# Patient Record
Sex: Male | Born: 1965 | Race: White | Hispanic: No | Marital: Married | Smoking: Never smoker
Health system: Southern US, Community
[De-identification: ages and names within clinical notes are randomized; demographics above are authoritative.]

## PROBLEM LIST (undated history)

## (undated) DIAGNOSIS — I2510 Atherosclerotic heart disease of native coronary artery without angina pectoris: Secondary | ICD-10-CM

## (undated) DIAGNOSIS — Z9289 Personal history of other medical treatment: Secondary | ICD-10-CM

## (undated) DIAGNOSIS — I10 Essential (primary) hypertension: Secondary | ICD-10-CM

## (undated) DIAGNOSIS — E785 Hyperlipidemia, unspecified: Secondary | ICD-10-CM

## (undated) DIAGNOSIS — K219 Gastro-esophageal reflux disease without esophagitis: Secondary | ICD-10-CM

## (undated) DIAGNOSIS — N529 Male erectile dysfunction, unspecified: Secondary | ICD-10-CM

## (undated) DIAGNOSIS — I209 Angina pectoris, unspecified: Secondary | ICD-10-CM

## (undated) DIAGNOSIS — Z9989 Dependence on other enabling machines and devices: Secondary | ICD-10-CM

## (undated) DIAGNOSIS — G4733 Obstructive sleep apnea (adult) (pediatric): Secondary | ICD-10-CM

## (undated) DIAGNOSIS — Z8249 Family history of ischemic heart disease and other diseases of the circulatory system: Secondary | ICD-10-CM

## (undated) DIAGNOSIS — I219 Acute myocardial infarction, unspecified: Secondary | ICD-10-CM

## (undated) HISTORY — PX: TONSILLECTOMY AND ADENOIDECTOMY: SUR1326

## (undated) HISTORY — DX: Male erectile dysfunction, unspecified: N52.9

## (undated) HISTORY — DX: Atherosclerotic heart disease of native coronary artery without angina pectoris: I25.10

## (undated) HISTORY — PX: CHOLECYSTECTOMY: SHX55

## (undated) HISTORY — PX: OTHER SURGICAL HISTORY: SHX169

## (undated) HISTORY — PX: LACERATION REPAIR: SHX5168

## (undated) HISTORY — DX: Family history of ischemic heart disease and other diseases of the circulatory system: Z82.49

## (undated) HISTORY — DX: Obstructive sleep apnea (adult) (pediatric): Z99.89

## (undated) HISTORY — DX: Obstructive sleep apnea (adult) (pediatric): G47.33

## (undated) HISTORY — DX: Personal history of other medical treatment: Z92.89

---

## 2008-12-25 DIAGNOSIS — I219 Acute myocardial infarction, unspecified: Secondary | ICD-10-CM

## 2008-12-25 HISTORY — DX: Acute myocardial infarction, unspecified: I21.9

## 2009-07-05 ENCOUNTER — Encounter: Admission: RE | Admit: 2009-07-05 | Discharge: 2009-07-05 | Payer: Self-pay | Admitting: Cardiology

## 2009-07-09 ENCOUNTER — Inpatient Hospital Stay (HOSPITAL_COMMUNITY): Admission: RE | Admit: 2009-07-09 | Discharge: 2009-07-13 | Payer: Self-pay | Admitting: Cardiology

## 2009-07-09 HISTORY — PX: CORONARY ANGIOPLASTY WITH STENT PLACEMENT: SHX49

## 2010-02-22 ENCOUNTER — Observation Stay (HOSPITAL_COMMUNITY): Admission: EM | Admit: 2010-02-22 | Discharge: 2010-02-22 | Payer: Self-pay | Admitting: Emergency Medicine

## 2011-03-20 LAB — DIFFERENTIAL
Basophils Relative: 7 % — ABNORMAL HIGH (ref 0–1)
Eosinophils Absolute: 0.2 10*3/uL (ref 0.0–0.7)
Eosinophils Relative: 3 % (ref 0–5)
Neutrophils Relative %: 68 % (ref 43–77)

## 2011-03-20 LAB — POCT I-STAT, CHEM 8
Chloride: 107 mEq/L (ref 96–112)
Glucose, Bld: 104 mg/dL — ABNORMAL HIGH (ref 70–99)
HCT: 45 % (ref 39.0–52.0)
Sodium: 138 mEq/L (ref 135–145)
TCO2: 24 mmol/L (ref 0–100)

## 2011-03-20 LAB — CBC
Hemoglobin: 15 g/dL (ref 13.0–17.0)
MCHC: 33.9 g/dL (ref 30.0–36.0)
Platelets: 166 10*3/uL (ref 150–400)
RBC: 5.09 MIL/uL (ref 4.22–5.81)

## 2011-03-20 LAB — POCT CARDIAC MARKERS: Troponin i, poc: <0.05 ng/mL (ref 0.00–0.09)

## 2011-03-20 LAB — PROTIME-INR
INR: 1.03 (ref 0.00–1.49)
Prothrombin Time: 13.4 seconds (ref 11.6–15.2)

## 2011-03-28 ENCOUNTER — Observation Stay (HOSPITAL_COMMUNITY)
Admission: EM | Admit: 2011-03-28 | Discharge: 2011-03-29 | Disposition: A | Discharge status: Home / Self Care | Payer: BC Managed Care – PPO | Attending: Cardiology | Admitting: Cardiology

## 2011-03-28 ENCOUNTER — Emergency Department (HOSPITAL_COMMUNITY): Payer: BC Managed Care – PPO

## 2011-03-28 DIAGNOSIS — I10 Essential (primary) hypertension: Secondary | ICD-10-CM | POA: Insufficient documentation

## 2011-03-28 DIAGNOSIS — G4730 Sleep apnea, unspecified: Secondary | ICD-10-CM | POA: Insufficient documentation

## 2011-03-28 DIAGNOSIS — M79609 Pain in unspecified limb: Secondary | ICD-10-CM | POA: Insufficient documentation

## 2011-03-28 DIAGNOSIS — Z9861 Coronary angioplasty status: Secondary | ICD-10-CM | POA: Insufficient documentation

## 2011-03-28 DIAGNOSIS — R209 Unspecified disturbances of skin sensation: Secondary | ICD-10-CM | POA: Insufficient documentation

## 2011-03-28 DIAGNOSIS — I200 Unstable angina: Secondary | ICD-10-CM | POA: Insufficient documentation

## 2011-03-28 DIAGNOSIS — I2510 Atherosclerotic heart disease of native coronary artery without angina pectoris: Principal | ICD-10-CM | POA: Insufficient documentation

## 2011-03-28 DIAGNOSIS — E785 Hyperlipidemia, unspecified: Secondary | ICD-10-CM | POA: Insufficient documentation

## 2011-03-28 DIAGNOSIS — I252 Old myocardial infarction: Secondary | ICD-10-CM | POA: Insufficient documentation

## 2011-03-28 LAB — DIFFERENTIAL
Basophils Relative: 1 % (ref 0–1)
Eosinophils Absolute: 0.3 10*3/uL (ref 0.0–0.7)
Eosinophils Relative: 3 % (ref 0–5)
Lymphocytes Relative: 12 % (ref 12–46)
Neutro Abs: 6.1 10*3/uL (ref 1.7–7.7)

## 2011-03-28 LAB — CBC
HCT: 43.8 % (ref 39.0–52.0)
Hemoglobin: 15.5 g/dL (ref 13.0–17.0)
MCH: 29.2 pg (ref 26.0–34.0)
MCV: 82.5 fL (ref 78.0–100.0)
RBC: 5.31 MIL/uL (ref 4.22–5.81)
WBC: 8.2 10*3/uL (ref 4.0–10.5)

## 2011-03-28 LAB — COMPREHENSIVE METABOLIC PANEL
ALT: 45 U/L (ref 0–53)
AST: 23 U/L (ref 0–37)
Albumin: 4.3 g/dL (ref 3.5–5.2)
BUN: 10 mg/dL (ref 6–23)
CO2: 24 mEq/L (ref 19–32)
GFR calc non Af Amer: >60 mL/min (ref >60)
Glucose, Bld: 117 mg/dL — ABNORMAL HIGH (ref 70–99)
Total Bilirubin: 1.9 mg/dL — ABNORMAL HIGH (ref 0.3–1.2)
Total Protein: 7.3 g/dL (ref 6.0–8.3)

## 2011-03-28 LAB — CARDIAC PANEL(CRET KIN+CKTOT+MB+TROPI)
Total CK: 34 U/L (ref 7–232)
Total CK: 30 U/L (ref 7–232)
Troponin I: 0.01 ng/mL (ref 0.00–0.06)

## 2011-03-28 LAB — PROTIME-INR: INR: 1.04 (ref 0.00–1.49)

## 2011-03-28 LAB — HEMOGLOBIN A1C: Hgb A1c MFr Bld: 4.9 % (ref <5.7)

## 2011-03-28 LAB — TROPONIN I: Troponin I: 0.01 ng/mL (ref 0.00–0.06)

## 2011-03-28 LAB — POCT CARDIAC MARKERS: Myoglobin, poc: 30.9 ng/mL (ref 12–200)

## 2011-03-28 LAB — CK TOTAL AND CKMB (NOT AT ARMC): Relative Index: INVALID (ref 0.0–2.5)

## 2011-03-29 LAB — LIPID PANEL
Cholesterol: 136 mg/dL (ref 0–200)
HDL: 49 mg/dL (ref >39)
LDL Cholesterol: 73 mg/dL (ref 0–99)
Triglycerides: 68 mg/dL (ref <150)

## 2011-03-29 LAB — CBC
HCT: 43.8 % (ref 39.0–52.0)
Hemoglobin: 15.2 g/dL (ref 13.0–17.0)
MCH: 28.7 pg (ref 26.0–34.0)
RBC: 5.3 MIL/uL (ref 4.22–5.81)
RDW: 14.3 % (ref 11.5–15.5)
WBC: 7.9 10*3/uL (ref 4.0–10.5)

## 2011-03-29 LAB — CARDIAC PANEL(CRET KIN+CKTOT+MB+TROPI)
CK, MB: 0.7 ng/mL (ref 0.3–4.0)
Total CK: 32 U/L (ref 7–232)

## 2011-03-29 LAB — HEPARIN LEVEL (UNFRACTIONATED): Heparin Unfractionated: 0.26 IU/mL — ABNORMAL LOW (ref 0.30–0.70)

## 2011-03-31 NOTE — Procedures (Signed)
NAMECRISS, Chavez NO.:  [id number]  MEDICAL RECORD NO.:  [id number]           PATIENT TYPE:  O  LOCATION:  6522                         FACILITY:  [facility]  PHYSICIAN:  Landry Corporal, MD DATE OF BIRTH:  21-Mar-1966  DATE OF PROCEDURE:  03/28/2011 DATE OF DISCHARGE:  03/29/2011                           CARDIAC CATHETERIZATION   PRIMARY CARDIOLOGIST:  Dr. Lynnea Ferrier and Dr. Allyson Sabal from  [facility].  PERFORMING PHYSICIAN:  Landry Corporal, MD.  PROCEDURE PERFORMED: 1. Left heart catheterization via right radial artery access with 5-     French sheath. 2. Left ventriculogram via the RAO projection, 10 mL of contrast per     second for 30 mL. 3. Native coronary angiography. 4. Intracoronary nitroglycerin injection.  INDICATIONS:  New onset of chest pain concerning for unstable angina and patient with known coronary artery disease, status post PCI x3.  BRIEF HISTORY:  Mr. Kyle Chavez is a 45 year old gentleman with history of known coronary artery disease with ACS in July 2010, status post PCI of both the first obtuse marginal branch and circumflex with a Xience 2.5 x 20 mm stent as well as the right posterolateral branch with Xience 2.5 x 20 mm drug-eluting stent.  He then subsequently underwent cardiac catheterization in March 2011 and was noted to have a widely patent stents with a roughly 70%-80% tandem lesions in the ramus intermedius /high obtuse marginal vessel.  He now presented to [facility] with a new onset of chest pain concerning for unstable angina and is referred for diagnostic cardiac catheterization.  Risks, benefits, alternatives and indications of procedure were explained to the patient in detail and informed consent was obtained with a sign and placed on the chart.  PROCEDURE:  The patient was brought to second floor [facility] in a fasting state.  A modified Allen test  was performed on the right wrist and noted to have excellent collateral flow on scout angiography.  Therefore, the patient was prepped and draped with the right radial artery access site sterilely.  A time-out period was performed and the patient was sedated with intravenous Versed and fentanyl.  The right wrist was anesthetized with 1% subcutaneous lidocaine and the right radial artery was then accessed using Seldinger technique with placement of 5-French sheath.  Sheath was aspirated and flushed and infiltrated with a total 10 mL of standard radial cocktail. The additional intravenous heparin bolus of weight-based was administered, at the time, the patient received 50 units/kg.  Then, a 5- [place] TIG 4.0 catheter was advanced over safety J-wire and used to engage first the left and right coronary artery.  Multiple angiographic views of first right and left coronary artery system were obtained. With the catheter in the left coronary artery, intracoronary nitroglycerin was administered to better evaluate the high obtuse marginal/ramus intermedius branch lesion.  Multiple passes between the previous catheterizations and the current catheterization were done by both myself and Dr. Allyson Sabal as well as multiple members of the cath lab staff.  After much deliberation, decision was made to proceed with performing left ventriculogram, therefore, the TIG catheter  was removed over the safety J-wire and exchanged for a 5-French angled pigtail catheter, which was used to advance across the aortic valve for measurement of left ventricular hemodynamics.  Left ventriculogram was then performed as described above.  After completion, the left ventricular hemodynamics were then remeasured and the catheter was pulled back across the aortic valve, measuring the pullback gradient. The catheter was then removed out of the body over wire without any complications.  As decision was made to terminate the  procedure, the sheath was then removed in the catheterization lab with placement of TR band with 30 mL of air at 1324 hours.  There is no complications.  Estimated blood loss was less than 10 mL.  The patient is stable for during and after the procedure.  CATHETERIZATION STATISTICS: 1. Sedation:  2 mg of Versed and 25 mcg of fentanyl. 2. Radial cocktail:  400 mcg nitroglycerin, 2 mL of 1% lidocaine, 5 mg     of verapamil, and diluted in 10 mL of normal saline. 3. Contrast:  Total 90 mL. 4. Heparin bolus:  Weight-based 50 units/kg was administered at the     time of sheath placement.  HEMODYNAMICS: 1. Aortic pressure 101/68 mmHg with mean of 84 mmHg. 2. Left ventricular pressure 103/30 mmHg with an EDP of 10 mmHg. 3. Left ventriculography demonstrated vigorous LV contraction with EF     of 65%.  No wall motion abnormalities.  ANGIOGRAPHIC FINDINGS: 1. Left main is a short vessel which is essentially trifurcates into     the LAD and circumflex, but there is a very high obtuse marginal,     which could be either considered obtuse marginal versus ramus     intermedius. 2. There is no evidence of disease in the left main. 3. The LAD is a large-caliber vessel, which reaches down to the apex     and gives rise to 2 diagonal branches and the LAD itself was free     of any significant disease. 4. The circumflex artery courses down to the atrioventricular groove.     It gives rise to moderate-to-large caliber obtuse marginal before     going to the atrioventricular groove.  There is a widely patent     stent in the proximal portion of this vessel. 5. The ramus intermedius/high-obtuse marginal courses in a vessel with     large distribution.  It may be 2-mm to 2.25 mm at the greatest.       There are tandem 70% lesions, one proximal, one mid that were similar      appearance to the previous cardiac catheterization in March 2011. 6. The right coronary artery is a moderate-to-large caliber  vessel, is     dominant, giving rise to posterior descending artery as well as     posterolateral branch.  The posterolateral branch came as a large     distribution and has a widely patent stent just after the bend from     the right posterior atrioventricular groove artery.  IMPRESSION: 1. Stable ramus intermedius disease without any significant change     from March 2011, not likely due to the culprit for acute onset     chest pain after the entire year of being stable. 2. Widely patent stents in the right posterolateral 1 branch as well     as the obtuse marginal 1 branch. 3. Consider possible spasm versus nonanginal chest pain.  PLAN: 1. Continue to optimize medical management.  We will increase  his     Imdur dose and likely we will have not to increase his beta-blocker     as an outpatient. 2. Scheduled for outpatient Myoview to evaluate for any evidence of     significant ischemia in the anterolateral distribution consistent     with ramus intermedius.  If significant ischemia is noted, consider     planned intervention of this vessel.  The thought at this time was     that a relatively small-caliber vessel and two tandem lesions would     require a very long stent that would have to be brought back almost     to the parent vessel and this was thought     to be potentially a challenging procedure to avoid encroaching upon     the very proximal circumflex and even left main artery.  Case was reviewed with Dr. Allyson Sabal and discussed with Dr. Lynnea Ferrier, both agreed to this plan.          ______________________________ Landry Corporal, MD     DWH/MEDQ  D:  03/29/2011  T:  03/30/2011  Job:  [id number]  cc:   [facility] Nanetta Batty, M.D. Ritta Slot, MD  Electronically Signed by Bryan Lemma MD on 03/31/2011 09:58:39 AM

## 2011-04-02 LAB — CK TOTAL AND CKMB (NOT AT ARMC)
CK, MB: 7.7 ng/mL — ABNORMAL HIGH (ref 0.3–4.0)
Relative Index: INVALID (ref 0.0–2.5)

## 2011-04-02 LAB — BASIC METABOLIC PANEL
BUN: 22 mg/dL (ref 6–23)
BUN: 9 mg/dL (ref 6–23)
CO2: 25 mEq/L (ref 19–32)
Chloride: 101 mEq/L (ref 96–112)
Chloride: 97 mEq/L (ref 96–112)
Creatinine, Ser: 0.72 mg/dL (ref 0.4–1.5)
Creatinine, Ser: 0.87 mg/dL (ref 0.4–1.5)
GFR calc Af Amer: >60 mL/min (ref >60)
GFR calc non Af Amer: >60 mL/min (ref >60)
GFR calc non Af Amer: >60 mL/min (ref >60)
Glucose, Bld: 108 mg/dL — ABNORMAL HIGH (ref 70–99)
Glucose, Bld: 113 mg/dL — ABNORMAL HIGH (ref 70–99)
Potassium: 3.4 mEq/L — ABNORMAL LOW (ref 3.5–5.1)
Potassium: 3.8 mEq/L (ref 3.5–5.1)
Potassium: 4.2 mEq/L (ref 3.5–5.1)
Sodium: 136 mEq/L (ref 135–145)

## 2011-04-02 LAB — CARDIAC PANEL(CRET KIN+CKTOT+MB+TROPI)
CK, MB: 0.8 ng/mL (ref 0.3–4.0)
CK, MB: 1.5 ng/mL (ref 0.3–4.0)
CK, MB: 10.3 ng/mL — ABNORMAL HIGH (ref 0.3–4.0)
CK, MB: 15.1 ng/mL — ABNORMAL HIGH (ref 0.3–4.0)
CK, MB: 29.6 ng/mL — ABNORMAL HIGH (ref 0.3–4.0)
Relative Index: 12.3 — ABNORMAL HIGH (ref 0.0–2.5)
Relative Index: 13.7 — ABNORMAL HIGH (ref 0.0–2.5)
Total CK: 37 U/L (ref 7–232)
Troponin I: 1.41 ng/mL (ref 0.00–0.06)
Troponin I: 2.08 ng/mL (ref 0.00–0.06)
Troponin I: 2.87 ng/mL (ref 0.00–0.06)

## 2011-04-02 LAB — MAGNESIUM: Magnesium: 1.8 mg/dL (ref 1.5–2.5)

## 2011-04-02 LAB — CBC
HCT: 42.8 % (ref 39.0–52.0)
HCT: 43.3 % (ref 39.0–52.0)
Hemoglobin: 14.8 g/dL (ref 13.0–17.0)
Hemoglobin: 15.2 g/dL (ref 13.0–17.0)
MCV: 86.8 fL (ref 78.0–100.0)
Platelets: 149 10*3/uL — ABNORMAL LOW (ref 150–400)
Platelets: 151 10*3/uL (ref 150–400)
Platelets: 169 10*3/uL (ref 150–400)
RBC: 4.94 MIL/uL (ref 4.22–5.81)
RBC: 4.98 MIL/uL (ref 4.22–5.81)
RBC: 5.12 MIL/uL (ref 4.22–5.81)
WBC: 10.3 10*3/uL (ref 4.0–10.5)
WBC: 10.9 10*3/uL — ABNORMAL HIGH (ref 4.0–10.5)
WBC: 9 10*3/uL (ref 4.0–10.5)

## 2011-04-02 LAB — TROPONIN I: Troponin I: 2.39 ng/mL (ref 0.00–0.06)

## 2011-04-17 NOTE — Discharge Summary (Signed)
NAMEBERT, PTACEK             ACCOUNT NO.:  [id number]  MEDICAL RECORD NO.:  [id number]           PATIENT TYPE:  O  LOCATION:  6522                         FACILITY:  [facility]  PHYSICIAN:  Wilburt Finlay, PA        DATE OF BIRTH:  [birth year]  DATE OF ADMISSION:  03/28/2011 DATE OF DISCHARGE:  03/29/2011                              DISCHARGE SUMMARY   DISCHARGE DIAGNOSES: 1. Chest pain, unstable angina. 2. Hypertension. 3. Coronary artery disease status post stent to the distal circumflex     and RCA as well as residual disease to the ramus July of 2010.     Left heart cath this admission showed stable coronary disease.  No     change from February 22, 2010, and widely patent stents in the RCA OM1.     Ejection fraction 65%. 4. History of sleep apnea. 5. History of hyperlipidemia.  HOSPITAL COURSE:  Mr. Lambertson is a 45 year old Caucasian male with history of coronary artery disease with stents in July of 2010, to the distal RCA and circumflex artery.  He also has a diffusely diseased ramus.  History also includes sleep apnea, hypertension, hyperlipidemia, erectile dysfunction.  He has a strong family history of heart disease. His father was 37.  He has had 4 heart attacks.  Both paternal grandfather and maternal grandfather had mass of MI's and his brother had heart attack at age 54.  His last stress test was August 03, 2009, which showed an ejection fraction of 71% low-risk scan and his last cath was prior to this admission was March 2011, which revealed patent stents unchanged anatomy.  Mr. Luffman presented with chest pain with radiation to the left shoulder, received some relief with one nitroglycerin.  He was admitted to rule out acute coronary syndrome. Cardiac enzymes were cycled.  The patient is given IV morphine 2 mg for pain in the ED, cycles cardiac enzymes q.8 hours, IV heparin was started, continued his home meds.  I will check the A1c, lipids, and given his personal  and family history, he was scheduled for left heart cath for March 29, 2011.  The patient stated that he had short episode of chest pain the morning of March 29, 2011, lasted just a few minutes, and was resolved prior to being rounded on.  Left heart cath showed ejection fraction of 65%, patent stents in the right coronary artery and the circumflex as well as 70% diffuse occlusion in the ramus which is unchanged from prior cath in March of 2011.  Currently, the patient is stable, awaiting, and will be ready for discharge after appropriate rest.  DISCHARGE LABORATORY DATA:  WBC 7.9, hemoglobin 15.2, hematocrit 43.8, platelets 198.  Sodium 135, potassium 4.4, chloride 112, carbon dioxide 24, glucose 117, BUN 10, creatinine 0.80, total bilirubin 1.9, alk phos 50, AST 23, ALT 45, total protein 7.3, albumin 4.3, calcium 9.4.  A1c is 4.9, mean plasma glucose 94, lipase of 19.  Cardiac enzymes were negative x3.  Total cholesterol 136, triglycerides 68, HDL 49, LDL 73, VLDL 14, and total cholesterol HDL ratio is 2.8.  TSH is 1.610.  STUDIES/PROCEDURES:  Chest x-ray on March 28, 2011, shows no acute abnormality.  Heart remains normal in size.  Lungs are clear with normal vascularity.  Unremarkable bones.  Cholecystectomy clips noted. Cardiac catheterization on March 29, 2011, with ejection fraction 65%. Patent stents in the right coronary artery and circumflex with 70% diffuse disease in the ramus vessel.  CURRENT MEDICATIONS: 1. Nitroglycerin sublingual 0.4 mg 1 tablet under the tongue every 5     minutes up to 3 tablets total for chest pain. 2. Aspirin enteric-coated 325 mg 1 tablet by mouth daily. 3. Losartan 50 mg 1 tablet by mouth daily. 4. Propranolol SR 80 mg 1 tablet by mouth daily in the morning. 5. Bystolic 10 mg 1 tablet by mouth daily. 6. Famotidine 40 mg 1 tablet by mouth daily. 7. Plavix 75 mg 1 tablet by mouth daily. 8. Nifedipine XR 30 mg 1 tablet by mouth daily. 9. Imdur XR 60  mg 1 tablet by mouth daily. 10.Pravastatin 20 mg 1 tablet by mouth daily.  DISPOSITION:  Mr. Ursua will be discharged home in stable condition. He is to increase activity slowly.  It is recommended that he eats heart- healthy diet.  He is okay to shower and bathe.  No lifting for 2 days with his right arm.  No driving for 2 days.  If catheter site becomes red, painful, swollen, or discharge of fluid or pus, he is to call our office immediately, and he will follow up Dr. Lynnea Ferrier, at [facility] approximately 3-4 weeks and our office will call him with the appointment.          ______________________________ Wilburt Finlay, PA     BH/MEDQ  D:  03/29/2011  T:  03/30/2011  Job:  [id number]  cc:   Ritta Slot, MD  Electronically Signed by Wilburt Finlay PA on 04/07/2011 04:27:42 PM Electronically Signed by Ritta Slot MD on 04/17/2011 04:04:21 PM

## 2011-04-18 DIAGNOSIS — Z9289 Personal history of other medical treatment: Secondary | ICD-10-CM

## 2011-04-18 HISTORY — DX: Personal history of other medical treatment: Z92.89

## 2011-05-09 NOTE — Discharge Summary (Signed)
[facility]             ACCOUNT NO.:  [id number]   MEDICAL RECORD NO.:  [id number]          PATIENT TYPE:  INP   LOCATION:  2917                         FACILITY:  [facility]   PHYSICIAN:  Cristy Hilts. Jacinto Halim, MD       DATE OF BIRTH:  11-12-1966   DATE OF ADMISSION:  07/09/2009  DATE OF DISCHARGE:  07/13/2009                               DISCHARGE SUMMARY   DISCHARGE DIAGNOSES:  1. Chest pain, that is classic angina pectoris.  2. Coronary artery disease with 95% posterolateral (coronary) artery      stenosis undergoing percutaneous transluminal angioplasty and stent      deployment with XIENCE drug-eluting stent, circumflex with 80%      stenosis undergoing percutaneous transluminal coronary angioplasty      and stent with XIENCE drug-eluting stent.      a.     Residual coronary disease with 80-90% obtuse marginal       (coronary artery) 1 disease of the small vessel, 30% diag 2       disease, and 20% left anterior descending (coronary artery)       disease.  3. Normal ejection fraction of 60%.  4. Peri-procedure myocardial infarction with postprocedure chest pain,      resolved at discharge.  5. Hypertension, now controlled and has been hypotensive prior to      discharge.  6. Symptoms of obstructive sleep apnea.  7. Hyperlipidemia.  8. Nonsustained ventricular tachycardia, resolved.  9. Strong family history of coronary artery disease.   DISCHARGE CONDITION:  Improved.   PROCEDURES:  1. Combined left heart cath, July 09, 2009, by Dr. Jacinto Halim.   1. Percutaneous transluminal coronary angioplasty and stent deployment      to the posterolateral (coronary) artery with a drug-eluting stent      by Dr. Jacinto Halim and percutaneous transluminal coronary angioplasty and      drug-eluting stent XIENCE to the circumflex by Dr. Jacinto Halim, continues      with residual disease.   DISCHARGE MEDICATIONS:  1. Lisinopril 5 mg 1 twice a day for blood pressure and heart.  2. Lopressor 50 mg 1 twice a  day for blood pressure and heart.  3. Crestor 20 mg 1 daily for cholesterol.  4. Plavix 75 mg 1 daily, do not stop, stopping could cause a heart      attack.  5. Enteric-coated aspirin 325 mg 1 daily for stent.  6. Pepcid 40 mg 1 daily to protect her stomach.  7. Nitroglycerin 1/150 with 1 under tongue, 1 every 5 minutes if      needed for chest pain while sitting, 1 every 5 minutes up to 3      every 15 minutes.  If no relief, call 911.  8. Stop Micardis HCT as needed.  9. Stop amlodipine.  10.Stop Inderal 40 b.i.d.  11.Hold Cialis 20 mg half as needed until you see Dr. Allyson Sabal back in      the office.   DISCHARGE INSTRUCTIONS:  1. Return to work after you see Dr. Allyson Sabal.  2. Low-sodium heart-healthy diet.  3. Wash cath site  with soap and water.  Call if any bleeding,      swelling, or drainage.  4. Increase activity slowly.  No lifting for 1 week.  No driving for 1      week.  5. Followup with Dr. Allyson Sabal, July 23, 2009, at 2:15 p.m.   HOSPITAL COURSE:  The patient was admitted for cardiac catheterization  after seeing Dr. Jacinto Halim with complaints of chest pain.  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  He would have chest pain despite climbing two  flights of steps.  Then, pain has increased in length and in intensity.  He saw Dr. Lorin Picket, ordered treadmill stress test, and was sent to Dr.  Jacinto Halim.  The patient also has a strong family history of premature  coronary disease.  Father with open heart surgery, massive MI at 9;  maternal grandfather, massive MI in his 4s and died in his 42s; one  brother who had a MI in his 49s; another uncle had open heart surgery in  his 40s.  Question was to proceed with stress Myoview versus cardiac  catheterization, but because of the severity of the family history and  his classic symptoms of angina, cardiac  catheterization was chosen.  Dr.  Lucila Maine agreed with this plan.  The patient also complained of  symptoms of obstructive sleep apnea.   The patient came to [facility] and underwent cardiac cath revealing  significant coronary disease and undergoing PTA and stents as stated  previously, has residual disease, that will be treated medically.   Postprocedure, his cardiac enzymes were positive.  Further chest pain  medications were adjusted and he was kept in the step-down unit.   On July 11, 2009, cardiac cath films were reviewed.  It was felt we  would just monitor him and Imdur was added, but blood pressure dropped  with Imdur, so therefore, this was discontinued as well as the Norvasc.   Prior to his discharge home, his lisinopril was 10 mg twice a day and  Lopressor 50 twice a day, but blood pressure dropped again into the 80s;  therefore, he was kept a couple more hours, encouraged to drink fluids,  and lisinopril has been decreased to 5 mg twice a day.   LABORATORY DATA:  Hemoglobin at discharge 15.2, hematocrit 43.3, WBC 9,  platelets 151, MCV 86.9.   Chemistry at one point, sodium was 132, potassium 3.4.  He was given  potassium supplement.  At discharge, sodium 135, potassium 3.8, chloride  100, CO2 of 23, BUN 22, creatinine 0.87, glucose 97.  Coags were 13.9,  INR of 1, and PTT 28.   Cardiac enzymes, initial CK was 98 with MB of 10.3, bumped up to 264  with MB of 36 up to 241 with MB of 29.6 and prior to discharge, CK 28,  MB 0.8, troponin I initially was 0.25, bumped to a high of 3 that was  peak and prior to discharge troponin I of 1.14.   Magnesium was 1.8, calcium 9.   The patient ambulated with cardiac rehab, had no real dizziness with  blood pressure in the 80s, but we will monitor, and once blood pressure  slightly improves, we will discharge home on July 13, 2009.  He will  follow up with Dr. Allyson Sabal on new medications.  He will  need to hold the  Cialis until  after he sees Dr. Allyson Sabal to ensure he is stable prior to  using this medication.      Darcella Gasman. [place], N.P.      Cristy Hilts. Jacinto Halim, MD     LRI/MEDQ  D:  07/13/2009  T:  07/14/2009  Job:  [id number]   cc:   Dr. Colbert Ewing, MD

## 2011-05-09 NOTE — Discharge Summary (Signed)
[facility], [facility]             ACCOUNT NO.:  [id number]   MEDICAL RECORD NO.:  [id number]          PATIENT TYPE:  INP   LOCATION:  2917                         FACILITY:  [facility]   PHYSICIAN:  Cristy Hilts. Jacinto Halim, MD       DATE OF BIRTH:  11/10/1966   DATE OF ADMISSION:  07/09/2009  DATE OF DISCHARGE:  07/13/2009                               DISCHARGE SUMMARY   ADDENDUM   Put [name] out of work at least through July 23, 2009,  additionally we gave him Lortab 1 tab every 6 hours as needed for back  pain after lying in the bed for so long, he was having back discomfort.      Darcella Gasman. [place], N.P.      Cristy Hilts. Jacinto Halim, MD     LRI/MEDQ  D:  07/13/2009  T:  07/14/2009  Job:  [id number]   cc:   Nanetta Batty, M.D.  Lucila Maine

## 2011-05-09 NOTE — Cardiovascular Report (Signed)
NAMEZIGMOND, TRELA             ACCOUNT NO.:  [id number]   MEDICAL RECORD NO.:  [id number]          PATIENT TYPE:  INP   LOCATION:  2502                         FACILITY:  [facility]   PHYSICIAN:  Cristy Hilts. Jacinto Halim, MD       DATE OF BIRTH:  10-01-1966   DATE OF PROCEDURE:  07/09/2009  DATE OF DISCHARGE:                            CARDIAC CATHETERIZATION   PROCEDURE PERFORMED:  Left heart catheterization including:  1. Selective right and left coronary arteriography.  2. PTCA and direct stenting of the circumflex coronary artery.  3. PTCA and stenting of the posterolateral branch of the right      coronary artery.   INDICATIONS:  Mr. Obeso is a very pleasant 45 year old gentleman with  a strong family history of premature coronary artery disease.  He  presented with chest pain very typical for angina pectoris.  Given his  cardiac risk factors and high suspicion for coronary disease, he was  brought to the cardiac catheterization lab to evaluate his coronary  anatomy.   HEMODYNAMIC DATA:  The left ventricular pressure was 96/-4 with end  diastolic pressure of 4 mmHg.  Aortic pressure was 84/60 with a mean of  72 mmHg.  There was no significant pressure gradient across the aortic  valve.   Right coronary artery:  Right coronary artery is a large-caliber vessel  and a dominant vessel giving origin to a large PDA and large PLA.  The  PLA branch has an unstable plaque with a 95% stenoses.  This is a long  segment stenoses.   Left main coronary:  Left main coronary is short and bifurcates  immediately.   Circumflex:  Circumflex coronary artery is large-caliber vessel.  Gives  origin to high obtuse marginal 1 which is relatively large.  However, it  is a 1.75 at most 2-mm vessel.  It is a long segment stenoses of 90%  involved in this vessel.  The circumflex coronary artery itself after  the origin of the AV groove branch has an 80% stenoses.   LAD:  LAD is a large-caliber vessel.  It  has got mild luminal  irregularity.  Gives origin to moderate-sized diagonal I and diagonal  II.  The diagonal II has an ostial 20% stenoses.   INTERVENTION DATA:  Successful PTCA and direct stenting of the  circumflex coronary artery with implantation of a 2.5- x 23-mm XIENCE  stent.  The stent was deployed at 12 atmospheric pressure followed by  post dilatation with a 2.5- x 15-mm Voyager [place] at 18 atmospheric  pressure.  Stenosis reduced from 80% to 0% with brisk TIMI III to TIMI  III flow maintained at end of procedure.   Successful PTCA and initial predilatation because of inability to cross  the high-grade stenosis of the PL branch of the right coronary artery.  This was done with a 2.5- x 15-mm Voyager balloon at 10-12 atmospheric  pressure followed by a 2.5- x 28-mm XIENCE stent implantation at 10  atmospheric pressure for 30 seconds followed by post dilatation with a  2.5- x 15-mm Voyager [place] at 18 atmospheric pressure and  16 atmospheric  pressure x2 with overall reduction in stenosis from 95% to 0% with brisk  TIMI III to TIMI III flow maintained at the end of the procedure.   RECOMMENDATIONS:  The ramus intermediate branch is 1.75 to at most 2-mm  vessel.  I would recommend that he continue aggressive medical therapy,  and if he continues to have angina pectoris then consideration can be  given for using a scoring balloon to perform arthrotomy across the long  segment lesion which at least measures about 30-40 mm.   He will be discharged home in the morning.  Unless his cardiac enzymes  are positive, then I will keep him for another 24 hours.  Depending on  his clinical scenario if he is hemodynamically stable, I may discharge  him in the morning with high dose of statin.   A total of 125 mL of contrast was utilized for diagnostic and  interventional procedure.   TECHNIQUE OF PROCEDURE:  Under usual sterile precautions using a 5-  French right radial access, a Jacky  5-French catheter was utilized to  perform selective left to right coronary arteriography and also left  ventriculography.  The catheter was then pulled out of the body in the  usual fashion over an exchange-length 0.035th of an inch Rosen wire was  utilized for catheter exchanges.   Using Angiomax for anticoagulation, an 5-French XP 3.5 guide was  utilized to engage the left main coronary artery.  Using Angiomax and  maintaining ACT greater than 200, Asahi Prowater wire was advanced in  the circumflex coronary artery and direct stenting was performed.  Nitroglycerin was also administered intracoronary.  Angiography revealed  excellent results poststenting.  The wire was withdrawn  angiographically.  Guide catheter disengaged and pulled out of the body.   A 5-French JR-4 guide catheter was utilized to engage the right coronary  artery.  I initially tried to directly stent the lesion with a 2.5- x 28-  mm XIENCE stent because of inability to cross the stent.  Predilatation  was performed with a noncompliant 2.5- x 15-mm Voyager [place] followed by  stent implantation with excellent results.  Overall, the stenosis was  reduced from 95% to 0% with brisk TIMI III to TIMI III flow maintained  at the end of the procedure.   The patient did have some chest discomfort on and off during the  procedure which I expect him to.  No dissection or thrombus was evident.  No side branches were compromised.      Cristy Hilts. Jacinto Halim, MD     JRG/MEDQ  D:  07/09/2009  T:  07/10/2009  Job:  [id number]   cc:   Maryella Shivers. Lorin Picket, MD

## 2012-03-05 ENCOUNTER — Other Ambulatory Visit: Payer: Self-pay

## 2012-03-05 ENCOUNTER — Encounter (HOSPITAL_COMMUNITY)
Admission: AD | Disposition: A | Discharge status: Home / Self Care | Payer: Self-pay | Source: Other Acute Inpatient Hospital | Attending: Cardiovascular Disease

## 2012-03-05 ENCOUNTER — Encounter (HOSPITAL_COMMUNITY): Payer: Self-pay | Admitting: Cardiology

## 2012-03-05 ENCOUNTER — Inpatient Hospital Stay (HOSPITAL_COMMUNITY)
Admission: AD | Admit: 2012-03-05 | Discharge: 2012-03-06 | DRG: 287 | Disposition: A | Discharge status: Home / Self Care | Payer: PRIVATE HEALTH INSURANCE | Source: Other Acute Inpatient Hospital | Attending: Cardiovascular Disease | Admitting: Cardiovascular Disease

## 2012-03-05 DIAGNOSIS — I2510 Atherosclerotic heart disease of native coronary artery without angina pectoris: Principal | ICD-10-CM

## 2012-03-05 DIAGNOSIS — E785 Hyperlipidemia, unspecified: Secondary | ICD-10-CM | POA: Diagnosis present

## 2012-03-05 DIAGNOSIS — Z8249 Family history of ischemic heart disease and other diseases of the circulatory system: Secondary | ICD-10-CM

## 2012-03-05 DIAGNOSIS — I200 Unstable angina: Secondary | ICD-10-CM | POA: Diagnosis present

## 2012-03-05 DIAGNOSIS — I10 Essential (primary) hypertension: Secondary | ICD-10-CM | POA: Diagnosis present

## 2012-03-05 DIAGNOSIS — Z9861 Coronary angioplasty status: Secondary | ICD-10-CM

## 2012-03-05 DIAGNOSIS — Z7902 Long term (current) use of antithrombotics/antiplatelets: Secondary | ICD-10-CM

## 2012-03-05 DIAGNOSIS — Z7982 Long term (current) use of aspirin: Secondary | ICD-10-CM

## 2012-03-05 HISTORY — DX: Angina pectoris, unspecified: I20.9

## 2012-03-05 HISTORY — DX: Acute myocardial infarction, unspecified: I21.9

## 2012-03-05 HISTORY — DX: Essential (primary) hypertension: I10

## 2012-03-05 HISTORY — DX: Hyperlipidemia, unspecified: E78.5

## 2012-03-05 HISTORY — PX: LEFT HEART CATHETERIZATION WITH CORONARY ANGIOGRAM: SHX5451

## 2012-03-05 HISTORY — PX: CARDIAC CATHETERIZATION: SHX172

## 2012-03-05 SURGERY — LEFT HEART CATHETERIZATION WITH CORONARY ANGIOGRAM
Anesthesia: LOCAL

## 2012-03-05 MED ORDER — ACETAMINOPHEN 325 MG PO TABS: 650.0000 mg | ORAL_TABLET | ORAL | Status: DC | PRN

## 2012-03-05 MED ORDER — PROPRANOLOL HCL ER 80 MG PO CP24
80.0000 mg | ORAL_CAPSULE | Freq: Every day | ORAL | Status: DC
  Administered 2012-03-05: 80 mg via ORAL
  Filled 2012-03-05 (×2): qty 1

## 2012-03-05 MED ORDER — ASPIRIN 81 MG PO CHEW: 81.0000 mg | CHEWABLE_TABLET | Freq: Every day | ORAL | Status: DC

## 2012-03-05 MED ORDER — LOSARTAN POTASSIUM 50 MG PO TABS: 50.0000 mg | ORAL_TABLET | Freq: Every day | ORAL | Status: DC

## 2012-03-05 MED ORDER — TOPIRAMATE 25 MG PO CPSP: 25.0000 mg | ORAL_CAPSULE | Freq: Every day | ORAL | Status: DC

## 2012-03-05 MED ORDER — SODIUM CHLORIDE 0.9 % IV SOLN: INTRAVENOUS | Status: DC

## 2012-03-05 MED ORDER — LOSARTAN POTASSIUM 50 MG PO TABS
50.0000 mg | ORAL_TABLET | Freq: Every day | ORAL | Status: DC
  Administered 2012-03-06: 50 mg via ORAL
  Filled 2012-03-05: qty 1

## 2012-03-05 MED ORDER — ASPIRIN 81 MG PO CHEW: 324.0000 mg | CHEWABLE_TABLET | ORAL | Status: DC

## 2012-03-05 MED ORDER — ISOSORBIDE MONONITRATE ER 30 MG PO TB24: 30.0000 mg | ORAL_TABLET | Freq: Every day | ORAL | Status: DC

## 2012-03-05 MED ORDER — SODIUM CHLORIDE 0.9 % IV SOLN
INTRAVENOUS | Status: AC
  Administered 2012-03-05: 16:00:00 via INTRAVENOUS

## 2012-03-05 MED ORDER — SODIUM CHLORIDE 0.9 % IJ SOLN: 3.0000 mL | Freq: Two times a day (BID) | INTRAMUSCULAR | Status: DC

## 2012-03-05 MED ORDER — ISOSORBIDE MONONITRATE ER 30 MG PO TB24
30.0000 mg | ORAL_TABLET | Freq: Every day | ORAL | Status: DC
  Administered 2012-03-06: 30 mg via ORAL
  Filled 2012-03-05: qty 1

## 2012-03-05 MED ORDER — ASPIRIN EC 81 MG PO TBEC
81.0000 mg | DELAYED_RELEASE_TABLET | Freq: Every day | ORAL | Status: DC
  Administered 2012-03-06: 81 mg via ORAL
  Filled 2012-03-05: qty 1

## 2012-03-05 MED ORDER — FENTANYL CITRATE 0.05 MG/ML IJ SOLN
INTRAMUSCULAR | Status: AC
  Filled 2012-03-05: qty 2

## 2012-03-05 MED ORDER — ZOLPIDEM TARTRATE 5 MG PO TABS: 10.0000 mg | ORAL_TABLET | Freq: Every evening | ORAL | Status: DC | PRN

## 2012-03-05 MED ORDER — DIAZEPAM 5 MG PO TABS: 5.0000 mg | ORAL_TABLET | ORAL | Status: DC

## 2012-03-05 MED ORDER — NIFEDIPINE ER 30 MG PO TB24: 30.0000 mg | ORAL_TABLET | Freq: Every day | ORAL | Status: DC

## 2012-03-05 MED ORDER — MORPHINE SULFATE 4 MG/ML IJ SOLN
2.0000 mg | Freq: Once | INTRAMUSCULAR | Status: AC
  Administered 2012-03-05: 2 mg via INTRAVENOUS

## 2012-03-05 MED ORDER — CLOPIDOGREL BISULFATE 75 MG PO TABS
75.0000 mg | ORAL_TABLET | Freq: Every day | ORAL | Status: DC
  Administered 2012-03-06: 75 mg via ORAL
  Filled 2012-03-05: qty 1

## 2012-03-05 MED ORDER — HEPARIN (PORCINE) IN NACL 2-0.9 UNIT/ML-% IJ SOLN
INTRAMUSCULAR | Status: AC
  Filled 2012-03-05: qty 2000

## 2012-03-05 MED ORDER — MORPHINE SULFATE 4 MG/ML IJ SOLN
INTRAMUSCULAR | Status: AC
  Filled 2012-03-05: qty 1

## 2012-03-05 MED ORDER — ONDANSETRON HCL 4 MG/2ML IJ SOLN
INTRAMUSCULAR | Status: AC
  Filled 2012-03-05: qty 2

## 2012-03-05 MED ORDER — ALPRAZOLAM 0.5 MG PO TABS: 0.5000 mg | ORAL_TABLET | Freq: Three times a day (TID) | ORAL | Status: DC | PRN

## 2012-03-05 MED ORDER — SODIUM CHLORIDE 0.9 % IV SOLN: 250.0000 mL | INTRAVENOUS | Status: DC | PRN

## 2012-03-05 MED ORDER — LIDOCAINE HCL (PF) 1 % IJ SOLN
INTRAMUSCULAR | Status: AC
  Filled 2012-03-05: qty 30

## 2012-03-05 MED ORDER — CARVEDILOL 12.5 MG PO TABS
12.5000 mg | ORAL_TABLET | Freq: Two times a day (BID) | ORAL | Status: DC
  Administered 2012-03-05 – 2012-03-06 (×2): 12.5 mg via ORAL
  Filled 2012-03-05 (×3): qty 1

## 2012-03-05 MED ORDER — CLOPIDOGREL BISULFATE 75 MG PO TABS: 75.0000 mg | ORAL_TABLET | Freq: Every day | ORAL | Status: DC

## 2012-03-05 MED ORDER — TOPIRAMATE 25 MG PO TABS
25.0000 mg | ORAL_TABLET | Freq: Every day | ORAL | Status: DC
  Administered 2012-03-05: 25 mg via ORAL
  Filled 2012-03-05 (×2): qty 1

## 2012-03-05 MED ORDER — SODIUM CHLORIDE 0.9 % IJ SOLN: 3.0000 mL | INTRAMUSCULAR | Status: DC | PRN

## 2012-03-05 MED ORDER — FAMOTIDINE 40 MG PO TABS: 40.0000 mg | ORAL_TABLET | Freq: Every day | ORAL | Status: DC

## 2012-03-05 MED ORDER — NIFEDIPINE ER 30 MG PO TB24
30.0000 mg | ORAL_TABLET | Freq: Every day | ORAL | Status: DC
  Administered 2012-03-06: 30 mg via ORAL
  Filled 2012-03-05: qty 1

## 2012-03-05 MED ORDER — TRAMADOL HCL 50 MG PO TABS
50.0000 mg | ORAL_TABLET | Freq: Four times a day (QID) | ORAL | Status: DC | PRN
  Filled 2012-03-05: qty 1

## 2012-03-05 MED ORDER — ACETAMINOPHEN 325 MG PO TABS: 650.0000 mg | ORAL_TABLET | Freq: Four times a day (QID) | ORAL | Status: DC | PRN

## 2012-03-05 MED ORDER — ONDANSETRON HCL 4 MG/2ML IJ SOLN: 4.0000 mg | Freq: Four times a day (QID) | INTRAMUSCULAR | Status: DC | PRN

## 2012-03-05 MED ORDER — ONDANSETRON HCL 4 MG/2ML IJ SOLN
4.0000 mg | Freq: Four times a day (QID) | INTRAMUSCULAR | Status: DC | PRN
  Administered 2012-03-05: 4 mg via INTRAVENOUS

## 2012-03-05 MED ORDER — ASPIRIN 300 MG RE SUPP: 300.0000 mg | RECTAL | Status: DC

## 2012-03-05 MED ORDER — NITROGLYCERIN 0.4 MG SL SUBL: 0.4000 mg | SUBLINGUAL_TABLET | SUBLINGUAL | Status: DC | PRN

## 2012-03-05 MED ORDER — HEPARIN SODIUM (PORCINE) 5000 UNIT/ML IJ SOLN
5000.0000 [IU] | Freq: Three times a day (TID) | INTRAMUSCULAR | Status: DC
  Administered 2012-03-05 – 2012-03-06 (×2): 5000 [IU] via SUBCUTANEOUS
  Filled 2012-03-05 (×5): qty 1

## 2012-03-05 MED ORDER — NITROGLYCERIN 0.2 MG/ML ON CALL CATH LAB
INTRAVENOUS | Status: AC
  Filled 2012-03-05: qty 1

## 2012-03-05 MED ORDER — SIMVASTATIN 5 MG PO TABS
5.0000 mg | ORAL_TABLET | Freq: Every day | ORAL | Status: DC
  Administered 2012-03-05: 5 mg via ORAL
  Filled 2012-03-05 (×2): qty 1

## 2012-03-05 MED ORDER — PROPRANOLOL HCL ER BEADS 80 MG PO CP24: 80.0000 mg | ORAL_CAPSULE | Freq: Every day | ORAL | Status: DC

## 2012-03-05 MED ORDER — MORPHINE SULFATE 2 MG/ML IJ SOLN: 2.0000 mg | INTRAMUSCULAR | Status: DC | PRN

## 2012-03-05 MED ORDER — MORPHINE SULFATE 2 MG/ML IJ SOLN: 1.0000 mg | INTRAMUSCULAR | Status: DC | PRN

## 2012-03-05 MED ORDER — ASPIRIN EC 325 MG PO TBEC: 325.0000 mg | DELAYED_RELEASE_TABLET | Freq: Every day | ORAL | Status: DC

## 2012-03-05 MED ORDER — ALPRAZOLAM 0.25 MG PO TABS: 0.2500 mg | ORAL_TABLET | Freq: Two times a day (BID) | ORAL | Status: DC | PRN

## 2012-03-05 MED ORDER — FAMOTIDINE 40 MG PO TABS
40.0000 mg | ORAL_TABLET | Freq: Every day | ORAL | Status: DC
  Administered 2012-03-06: 40 mg via ORAL
  Filled 2012-03-05: qty 1

## 2012-03-05 MED ORDER — MIDAZOLAM HCL 2 MG/2ML IJ SOLN
INTRAMUSCULAR | Status: AC
  Filled 2012-03-05: qty 2

## 2012-03-05 NOTE — Op Note (Signed)
Kyle Chavez is a 46 y.o. male    [id number] LOCATION:  FACILITY: [facility]  PHYSICIAN: Nanetta Batty, M.D. 06/22/1966   DATE OF PROCEDURE:  03/05/2012  DATE OF DISCHARGE:  [facility]  CARDIAC CATHETERIZATION     History obtained from chart review. 45 y/o with a history of CAD, s/p DES 2010 to PLA and CFX. He has been restudied twice. Myoview 4/12 was low risk. He has been having chest pain off and on for the past week. He describes sharp localized chest pain under Lt breast. It is not exertional. He denies SOB or diaphoresis. He does have some associated pain in his teeth which is similar to his past angina. He was admitted to [facility] today and is now transferred for diagnostic cath.    PROCEDURE DESCRIPTION:    The patient was brought to the second floor  [facility] cath lab in the postabsorptive state. He was  premedicated with IV fentanyl and Versed. His right groin was prepped and shaved in usual sterile fashion. Xylocaine 1% was used  for local anesthesia. A 5 French sheath was inserted into the right common femoral  artery using standard Seldinger technique.5 French right and left Judkins diagnostic catheters along with a 5 French pigtail catheter were used for selective coronary angiography and left ventriculography respectively. Visipaque dye was used for the entirety of the case. Retrograde aortic, left ventricular and pullback pressures were recorded.   HEMODYNAMICS:    AO SYSTOLIC/AO DIASTOLIC: 105/64   LV SYSTOLIC/LV DIASTOLIC: 114/6  ANGIOGRAPHIC RESULTS:   1. Left main; normal  2. LAD; normal 3. Left circumflex; patent stent in the AV groove. There was a high first marginal branch and the ramus distribution to have a long segmental proximal 70% stenosis unchanged from prior interventions..  4. Right coronary artery; dominant with a patent stent in the posterior lateral branch. 5. Left ventriculography; RAO left  ventriculogram was performed using  25 mL of Visipaque dye at 12 mL/second. The overall LVEF estimated  60 %  Without wall motion abnormalities.  IMPRESSION:[name] has patent stents in the circumflex and posterolateral branches unchanged hematocrit anatomy compared to his prior cath. He is a chronically diseased small to medium size ramus branch probably not percutaneously revascularizable with normal LV function. Continue medical therapy will be recommended.  The sheath was removed and pressure was held on the groin to achieve hemostasis. The patient left lateral stable condition. The remainder, for 4 hours, be gently hydrated overnight and will does be discharged in the morning.  Runell Gess MD, [facility] 03/05/2012 2:38 PM

## 2012-03-05 NOTE — H&P (Signed)
Patient ID: Kyle Chavez MRN: [id number], DOB/AGE: 1966-09-20   Admit date: 03/05/2012   Primary Physician: Pcp Not In System Primary Cardiologist: Dr Allyson Sabal  HPI: 46 y/o with a history of CAD, s/p DES 2010 to PLA and CFX. He has been restudied twice. Myoview 4/12 was low risk. He has been having chest pain off and on for the past week. He describes sharp localized chest pain under Lt breast. It is not exertional. He denies SOB or diaphoresis. He does have some associated pain in his teeth which is similar to his past angina. He was admitted to [facility] today and is now transferred for diagnostic cath.   Problem List: Past Medical History  Diagnosis Date  . Hypertension   . Dyslipidemia     Past Surgical History  Procedure Date  . Cholecystectomy   . Rt arm surgery      Allergies: Allergies not on file   Home Medications No prescriptions prior to admission     Family History  Problem Relation Age of Onset  . Coronary artery disease       History   Social History  . Marital Status: Married    Spouse Name: N/A    Number of Children: N/A  . Years of Education: N/A   Occupational History  . Truck driver     dives for [employer]   Social History Main Topics  . Smoking status: Never Smoker   . Smokeless tobacco: Not on file  . Alcohol Use: Not on file  . Drug Use: Not on file  . Sexually Active: Not on file   Other Topics Concern  . Not on file   Social History Narrative  . No narrative on file     Review of Systems: General: negative for chills, fever, night sweats or weight changes.  Cardiovascular: negative for chest pain, dyspnea on exertion, edema, orthopnea, palpitations, paroxysmal nocturnal dyspnea or shortness of breath Dermatological: negative for rash Respiratory: negative for cough or wheezing Urologic: negative for hematuria Abdominal: negative for nausea, vomiting, diarrhea, bright red blood per rectum, melena, or  hematemesis Neurologic: negative for visual changes, syncope, or dizziness All other systems reviewed and are otherwise negative except as noted above.  Physical Exam: There were no vitals taken for this visit.  General appearance: alert, cooperative and no distress Neck: no carotid bruit, no JVD and supple, symmetrical, trachea midline Lungs: clear to auscultation bilaterally Heart: regular rate and rhythm Abdomen: soft, non-tender; bowel sounds normal; no masses,  no organomegaly Extremities: extremities normal, atraumatic, no cyanosis or edema Pulses: 2+ and symmetric Skin: Skin color, texture, turgor normal. No rashes or lesions Neurologic: Grossly normal    Labs:  No results found for this or any previous visit (from the past 24 hour(s)). Labs from [facility] reviewed and are WNL including TSH and Chol/ LDL   Radiology/Studies: No results found.  EKG:NSR without acute changes  ASSESSMENT AND PLAN:   Active Problems:  Unstable angina  CAD, staged PCI with DES 7/10, (PLA and CFX), relook, 2 OK, Nuc low risk 4/12  HTN (hypertension)  Dyslipidemia  Family history of coronary artery disease  Plan-Cath today   Signed, KILROY,LUKE K, PA-C 03/05/2012, 12:47 PMAgree with note written by Corine Shelter PAC  Known CAD with symptoms C/W [place]. Prior DES stents in AVG LCX and PLA. Other prob as as outlined. Transferred from [facility] for cath. Enz neg. Ekg without acute change. Other labs OK.  Runell Gess 03/05/2012 2:03 PM

## 2012-03-05 NOTE — H&P (Signed)
    Pt was reexamined and existing H & P reviewed. No changes found.  Runell Gess, MD [facility] 03/05/2012 2:05 PM

## 2012-03-06 MED ORDER — ISOSORBIDE MONONITRATE ER 30 MG PO TB24: 30.0000 mg | ORAL_TABLET | Freq: Every day | ORAL | Status: DC

## 2012-03-06 NOTE — Progress Notes (Signed)
UR Completed. Simmons, Suhaila Troiano F [phone]  

## 2012-03-06 NOTE — Discharge Summary (Signed)
Physician Discharge Summary  Patient ID: Kyle Chavez MRN: [id number] DOB/AGE: October 09, 1966 46 y.o.  Admit date: 03/05/2012 Discharge date: 03/06/2012  Admission Diagnoses: CAD  Discharge Diagnoses:  Active Problems:  Unstable angina  CAD, staged PCI with DES 7/10, (PLA and CFX), relook, 2 OK, Nuc low risk 4/12  HTN (hypertension)  Dyslipidemia  Family history of coronary artery disease   Discharged Condition: stable  Hospital Course:  45 y/o with a history of CAD, s/p DES 2010 to PLA and CFX. He has been restudied twice. Myoview 4/12 was low risk. He has been having chest pain off and on for the past week. He describes sharp localized chest pain under Lt breast. It is not exertional. He denies SOB or diaphoresis. He did have some associated pain in his teeth which is similar to his past angina. He was admitted to [facility] ([facility]) and transferred for diagnostic cath.  He presented for left heart cath(see details below).  No intervention was warranted.  Normal LV function.  He was seen by Dr. Allyson Sabal and will be DCd in stable condition on continued medical therapy.   Consults: None  Significant Diagnostic Studies:  Left heart cath PROCEDURE DESCRIPTION:  The patient was brought to the second floor  [facility] cath lab in the postabsorptive state. He was  premedicated with IV fentanyl and Versed. His right groin  was prepped and shaved in usual sterile fashion. Xylocaine 1% was used  for local anesthesia. A 5 French sheath was inserted into the right common femoral  artery using standard Seldinger technique.5 French right and left Judkins diagnostic catheters along with a 5 French pigtail catheter were used for selective coronary angiography and left ventriculography respectively. Visipaque dye was used for the entirety of the case. Retrograde aortic, left ventricular and pullback pressures were recorded.  HEMODYNAMICS:  AO SYSTOLIC/AO DIASTOLIC: 105/64  LV SYSTOLIC/LV  DIASTOLIC: 114/6  ANGIOGRAPHIC RESULTS:  1. Left main; normal  2. LAD; normal  3. Left circumflex; patent stent in the AV groove. There was a high first marginal branch and the ramus distribution to have a long segmental proximal 70% stenosis unchanged from prior interventions..  4. Right coronary artery; dominant with a patent stent in the posterior lateral branch.  5. Left ventriculography; RAO left ventriculogram was performed using  25 mL of Visipaque dye at 12 mL/second. The overall LVEF estimated  60 % Without wall motion abnormalities.  IMPRESSION:Kyle Chavez has patent stents in the circumflex and posterolateral branches unchanged hematocrit anatomy compared to his prior cath. He is a chronically diseased small to medium size ramus branch probably not percutaneously revascularizable with normal LV function. Continue medical therapy will be recommended.  The sheath was removed and pressure was held on the groin to achieve hemostasis. The patient left lateral stable condition. The remainder, for 4 hours, be gently hydrated overnight and will does be discharged in the morning.  Runell Gess MD, [facility]  03/05/2012  2:38 PM   Treatments: Diagnostic LHC  Discharge Exam: Blood pressure 118/81, pulse 71, temperature 99.1 F (37.3 C), temperature source Oral, resp. rate 18, SpO2 100.00%.   Disposition: 01-Home or Self Care  Discharge Orders    Future Orders Please Complete By Expires   Diet - low sodium heart healthy      Increase activity slowly      Discharge instructions      Comments:   No lifting or driving for three days.     Medication List  As  of 03/06/2012 11:14 AM   TAKE these medications         aspirin 81 MG chewable tablet   Chew 81 mg by mouth daily.      carvedilol 12.5 MG tablet   Commonly known as: COREG   Take 12.5 mg by mouth 2 (two) times daily with a meal.      clopidogrel 75 MG tablet   Commonly known as: PLAVIX   Take 75 mg by mouth daily.       famotidine 40 MG tablet   Commonly known as: PEPCID   Take 40 mg by mouth daily.      isosorbide mononitrate 30 MG 24 hr tablet   Commonly known as: IMDUR   Take 1 tablet (30 mg total) by mouth daily.      losartan 50 MG tablet   Commonly known as: COZAAR   Take 50 mg by mouth daily.      NIFEdipine 30 MG 24 hr tablet   Commonly known as: PROCARDIA XL/ADALAT-CC   Take 30 mg by mouth daily.      pravastatin 20 MG tablet   Commonly known as: PRAVACHOL   Take 20 mg by mouth daily.      propranolol 80 MG 24 hr capsule   Commonly known as: INNOPRAN XL   Take 80 mg by mouth at bedtime. Patient also takes coreg 12.5mg  per patient.      topiramate 25 MG capsule   Commonly known as: TOPAMAX   Take 25 mg by mouth at bedtime.             SignedDwana Melena 03/06/2012, 11:14 AM

## 2012-03-06 NOTE — Progress Notes (Signed)
Subjective:  No CP/SOB  Objective:  Temp:  [97.5 F (36.4 C)-99.1 F (37.3 C)] 99.1 F (37.3 C) (03/13 0700) Pulse Rate:  [60-71] 71  (03/13 0700) Resp:  [16-18] 18  (03/13 0700) BP: (102-135)/(65-82) 118/81 mmHg (03/13 0700) SpO2:  [96 %-100 %] 100 % (03/13 0700) Weight change:   Intake/Output from previous day: 03/12 0701 - 03/13 0700 In: 742.5 [P.O.:480; I.V.:262.5] Out: 600 [Urine:600]  Intake/Output from this shift:    Physical Exam: General appearance: alert and cooperative Neck: no adenopathy, no carotid bruit, no JVD, supple, symmetrical, trachea midline and thyroid not enlarged, symmetric, no tenderness/mass/nodules Lungs: clear to auscultation bilaterally Heart: regular rate and rhythm, S1, S2 normal, no murmur, click, rub or gallop Extremities: extremities normal, atraumatic, no cyanosis or edema and Groin OK  Lab Results: No results found for this or any previous visit (from the past 48 hour(s)).  Imaging: Imaging results have been reviewed  Assessment/Plan:   1. Active Problems: 2.  Unstable angina 3.  CAD, staged PCI with DES 7/10, (PLA and CFX), relook, 2 OK, Nuc low risk 4/12 4.  HTN (hypertension) 5.  Dyslipidemia 6.  Family history of coronary artery disease 7.   Time Spent Directly with Patient:  20 minutes  Length of Stay:  LOS: 1 day   S/P cath. No change in anatomy. Exam benign. Groin OK. Medical therapy. ROV with a PA in 1-2 weeks and then an MD in 3 months. Can go back to work on Monday.  Runell Gess 03/06/2012, 8:44 AM

## 2013-03-13 ENCOUNTER — Encounter: Payer: Self-pay | Admitting: Diagnostic Neuroimaging

## 2013-03-13 ENCOUNTER — Telehealth: Payer: Self-pay | Admitting: Diagnostic Neuroimaging

## 2013-03-13 NOTE — Telephone Encounter (Signed)
Left detailed message informing appt sched for 03/14/13 cancld due to provider sched change.

## 2013-03-14 ENCOUNTER — Ambulatory Visit: Payer: Self-pay | Admitting: Diagnostic Neuroimaging

## 2013-03-19 NOTE — Telephone Encounter (Signed)
error 

## 2013-04-07 ENCOUNTER — Encounter: Payer: Self-pay | Admitting: Diagnostic Neuroimaging

## 2013-04-07 ENCOUNTER — Ambulatory Visit (INDEPENDENT_AMBULATORY_CARE_PROVIDER_SITE_OTHER): Payer: PRIVATE HEALTH INSURANCE | Admitting: Diagnostic Neuroimaging

## 2013-04-07 VITALS — BP 112/64 | HR 53 | Temp 98.8°F | Ht 66.0 in | Wt 148.0 lb

## 2013-04-07 DIAGNOSIS — M25519 Pain in unspecified shoulder: Secondary | ICD-10-CM

## 2013-04-07 DIAGNOSIS — M542 Cervicalgia: Secondary | ICD-10-CM

## 2013-04-07 DIAGNOSIS — M25512 Pain in left shoulder: Secondary | ICD-10-CM

## 2013-04-07 NOTE — Progress Notes (Signed)
[facility]  PATIENT: Kyle Chavez DOB: 1966-07-02  REFERRING CLINICIAN: R Scott  HISTORY FROM: patient and wife REASON FOR VISIT: new consult   HISTORICAL  CHIEF COMPLAINT:  Chief Complaint  Patient presents with  . Neurologic Problem    HISTORY OF PRESENT ILLNESS:   47 year old right-handed male with hypertension, hypercholesterolemia, coronary artery disease, migraine, here for evaluation of neck pain and shoulder pain.  For past 6-7 months patient reports aching, muscular pain in his left neck/trapezius region, sometimes right side, mainly radiating down his left arm and into his left elbow. 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 Patient has tried gabapentin 200 mg at night for past 2-3 months without relief. He has not tried physical therapy or any exercises. He was tried on prednisone pack without relief.  No report of injuries or other triggering factors. Patient had x-rays of the cervical spine which were unremarkable.  Patient also reports a "lump" in his left cervical paraspinal region. I looked at this region and to me this looks like a 1-2 inch lipoma.   REVIEW OF SYSTEMS: Full 14 system review of systems performed and notable only for fatigue swelling in the legs snoring easy bruising easy bleeding cramps decreased energy headaches snoring.  ALLERGIES: No Known Allergies  HOME MEDICATIONS: Outpatient Prescriptions Prior to Visit  Medication Sig Dispense Refill  . aspirin 81 MG chewable tablet Chew 81 mg by mouth daily.      . carvedilol (COREG) 12.5 MG tablet Take 12.5 mg by mouth 2 (two) times daily with a meal.      . clopidogrel (PLAVIX) 75 MG tablet Take 75 mg by mouth daily.      . famotidine (PEPCID) 40 MG tablet Take 40 mg by mouth daily.      Marland Kitchen losartan (COZAAR) 50 MG tablet Take 50 mg by mouth daily.      Marland Kitchen NIFEdipine (PROCARDIA XL/ADALAT-CC) 30 MG 24 hr tablet Take 30 mg by mouth daily.      . pravastatin  (PRAVACHOL) 20 MG tablet Take 20 mg by mouth daily.      . propranolol (INNOPRAN XL) 80 MG 24 hr capsule Take 80 mg by mouth at bedtime. Patient also takes coreg 12.5mg  per patient.      . topiramate (TOPAMAX) 25 MG capsule Take 25 mg by mouth at bedtime.       No facility-administered medications prior to visit.    PAST MEDICAL HISTORY: Past Medical History  Diagnosis Date  . Hypertension   . Dyslipidemia   . Myocardial infarction 2010  . Angina   . Migraine 03/05/12    "still have them every now and then"  . Heart disease     PAST SURGICAL HISTORY: Past Surgical History  Procedure Laterality Date  . Rt arm surgery  1960's    "caught in washing machine; have had 2 skin grafts there"  . Coronary angioplasty with stent placement  2010    "2"  . Cardiac catheterization  03/05/12  . Tonsillectomy and adenoidectomy      "when I was a kid"  . Cholecystectomy  ?1990's  . Laceration repair  ~ 2009    right thumb    FAMILY HISTORY: Family History  Problem Relation Age of Onset  . Coronary artery disease      SOCIAL HISTORY:  History   Social History  . Marital Status: Married    Spouse Name: N/A    Number of Children: N/A  .  Years of Education: N/A   Occupational History  . 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     Social History Main Topics  . Smoking status: Never Smoker   . Smokeless tobacco: Never Used  . Alcohol Use: Yes     Comment: "quit drinking ~ 2010"  . Drug Use: No  . Sexually Active: Not Currently   Other Topics Concern  . Not on file   Social History Narrative      Pt lives at home with his spouse.   Caffeine Use- Drinks soda daily     PHYSICAL EXAM  Filed Vitals:   04/07/13 1100  BP: 112/64  Pulse: 53  Temp: 98.8 F (37.1 C)  TempSrc: Oral  Height: 5\' 6"  (1.676 m)  Weight: 148 lb (67.132 kg)   Body mass index is 23.9 kg/(m^2).  GENERAL EXAM: Patient is in no distress. NECK SUPPLE. LEFT CERVICAL PARASPINAL  LIPOMA.  CARDIOVASCULAR: Regular rate and rhythm, no murmurs, no carotid bruits  NEUROLOGIC: MENTAL STATUS: awake, alert, language fluent, comprehension intact, naming intact CRANIAL NERVE: no papilledema on fundoscopic exam, pupils equal and reactive to light, visual fields full to confrontation, extraocular muscles intact, no nystagmus, facial sensation and strength symmetric, uvula midline, shoulder shrug symmetric, tongue midline. MOTOR: normal bulk and tone, full strength in the BUE, BLE SENSORY: normal and symmetric to light touch, pinprick, temperature, vibration COORDINATION: finger-nose-finger, fine finger movements normal REFLEXES: deep tendon reflexes present and symmetric GAIT/STATION: narrow based gait; DECREASED ARM SWING. Able to walk on toes, heels and tandem; romberg is negative   DIAGNOSTIC DATA (LABS, IMAGING, TESTING) - I reviewed patient records, labs, notes, testing and imaging myself where available.  Lab Results  Component Value Date   WBC 7.9 03/29/2011   HGB 15.2 03/29/2011   HCT 43.8 03/29/2011   MCV 82.6 03/29/2011   PLT 198 03/29/2011      Component Value Date/Time   NA 135 03/28/2011 0819   K 4.4 03/28/2011 0819   CL 102 03/28/2011 0819   CO2 24 03/28/2011 0819   GLUCOSE 117* 03/28/2011 0819   BUN 10 03/28/2011 0819   CREATININE 0.80 03/28/2011 0819   CALCIUM 9.1 03/28/2011 0819   PROT 7.3 03/28/2011 0819   ALBUMIN 4.3 03/28/2011 0819   AST 23 03/28/2011 0819   ALT 45 03/28/2011 0819   ALKPHOS 50 03/28/2011 0819   BILITOT 1.9* 03/28/2011 0819   GFRNONAA >60 03/28/2011 0819   GFRAA  Value: >60        The eGFR has been calculated using the MDRD equation. This calculation has not been validated in all clinical situations. eGFR's persistently <60 mL/min signify possible Chronic Kidney Disease. 03/28/2011 0819   Lab Results  Component Value Date   CHOL  Value: 136        ATP III CLASSIFICATION:  <200     mg/dL   Desirable  161-096  mg/dL   Borderline High  >=045    mg/dL   High         4/0/9811   HDL 49 03/29/2011   LDLCALC  Value: 73        Total Cholesterol/HDL:CHD Risk Coronary Heart Disease Risk Table                     Men   Women  1/2 Average Risk   3.4   3.3  Average Risk       5.0   4.4  2 X Average Risk   9.6   7.1  3 X Average Risk  23.4   11.0        Use the calculated Patient Ratio above and the CHD Risk Table to determine the patient's CHD Risk.        ATP III CLASSIFICATION (LDL):  <100     mg/dL   Optimal  045-409  mg/dL   Near or Above                    Optimal  130-159  mg/dL   Borderline  811-914  mg/dL   High  >782     mg/dL   Very High 08/29/6212   TRIG 68 03/29/2011   CHOLHDL 2.8 03/29/2011   Lab Results  Component Value Date   HGBA1C  Value: 4.9 (NOTE)                                                                       According to the ADA Clinical Practice Recommendations for 2011, when HbA1c is used as a screening test:   >=6.5%   Diagnostic of Diabetes Mellitus           (if abnormal result  is confirmed)  5.7-6.4%   Increased risk of developing Diabetes Mellitus  References:Diagnosis and Classification of Diabetes Mellitus,Diabetes Care,2011,34(Suppl 1):S62-S69 and Standards of Medical Care in         Diabetes - 2011,Diabetes Care,2011,34  (Suppl 1):S11-S61. 03/28/2011   No results found for this basename: [id number]   Lab Results  Component Value Date   TSH 0.492 03/28/2011     ASSESSMENT AND PLAN  47 y.o. year old male  has a past medical history of Hypertension; Dyslipidemia; Myocardial infarction (2010); Angina; Migraine (03/05/12); and Heart disease. here with neck pain, radiating to the left elbow. May represent cervical radiculopathy although I do not see any objective findings. Likely has musculoskeletal strain/sprain and spasm. I recommend conservative management with physical therapy, increase gabapentin and possible epidural steroid injections. For now patient would like to just try stretching exercises with physical therapy. He and his wife do not  want to try higher gabapentin or epidural steroid injections at this time.   He'll ask his PCP for Physical therapy referral. If this does not help, referral to pain management clinic may be considered.    Suanne Marker, MD 04/07/2013, 11:16 AM Certified in Neurology, Neurophysiology and Neuroimaging  [facility] [address] [place], [place] [address] [phone]

## 2013-04-07 NOTE — Patient Instructions (Signed)
NECK PAIN TREATMENTS -- In most cases, neck pain can be treated conservatively with over-the-counter pain medications, ice, heat and massage, and strengthening and/or stretching exercises at home. If pain does not improve after a few weeks of conservative treatment, further evaluation is usually recommended.    Pain relief -- Acetaminophen (eg, Tylenol and others) or a nonsteroidal antiinflammatory medication (eg, ibuprofen, naproxen) may be helpful to relieve mild to moderate neck pain. (See "Patient information: Nonsteroidal antiinflammatory drugs (NSAIDs) (Beyond the Basics)".)    For people with severe muscle spasm, a muscle relaxant may be recommended. For people with severe neck pain, a tricyclic antidepressant may also be recommended. Both tricyclic antidepressants and muscle relaxants can cause a person to feel sleepy.    Ice -- For some people, ice can reduce the severity of neck pain. It can be applied directly to the sore area of the neck. Ice can be frozen in a paper cup, and then the upper edge of the cup can be torn away. The ice should be moved continuously in strokes on the neck muscles for five to seven minutes.    To control sudden onset muscle tightness, place a bag of ice, bag of frozen peas, or a frozen towel wrapped in a dry towel, on the painful area. The ice should be left in place for 15 to 20 minutes to deeply penetrate the tissues; this can be repeated every two to four hours until symptoms improve.    The skin can become injured with excessive use of ice, especially in those with poor skin sensation. The skin should be inspected with each ice application for changes in pigmentation (eg, lighter or darker colored skin) and any changes should be reported to a healthcare provider.    Heat -- Heat can help to reduce pain in the neck muscles. Moist heat can be applied for 10 to 15 minutes in a shower, hot bath, or with a moist towel warmed in a microwave. However, acute injuries should utilize  ice as the initial treatment. Heat may be used initially for patients who have cold intolerance to ice.    It is important to avoid overheating the towel and potentially injuring the skin, especially in people with poor skin sensation. The skin can become pigmented (discolored) in a blotchy pattern in people who use heat frequently.    Massage -- Massage can be helpful for relieving muscle spasm and can be performed after heating or icing the neck. Massage can be done with the hands by applying pressure to both sides of the neck and the upper back muscles, or with an electric hand-held vibrator. The neck muscles should be relaxed during massage by supporting the head or lying down.    Stretching exercises -- The range of motion of the neck must be restored and preserved after an injury; this is done with exercises that stretch and strengthen the neck muscles. Range of motion exercises and stretching may help decrease pain from muscle injury.    Exercises can be performed in the morning to relieve stiffness and again at night before going to bed. Expect mild, achy muscle pain; sharp or electric pain in the shoulder or arm is not normal and should be reported to a healthcare provider.    Do not attempt to perform these exercises if you have a pinched nerve in the neck, especially if there is pain or numbness into the arm and hand, unless recommended by a healthcare provider. While these exercises can dramatically   improve symptoms of a pinched nerve, they can actually make the problem worse if performed improperly.    The most useful stretching exercises for the neck include the following:  ?Neck bending -- Tilt the head forward and try to touch your chin to your neck. Hold for a few seconds, breathe in gradually, and exhale slowly with each exercise. Exhaling with the movement helps relax the muscles. Repeat 10 to 15 times. Relax the neck and back muscles with each neck bend.    ?Shoulder rolls -- In the sitting or  standing position, hold the arms at the side with the elbows bent. Try to pinch the shoulder blades together. Roll the shoulders backwards 10 to 15 times, moving in a rhythmic, rowing motion. Rest. Roll the shoulders forwards 10 to 15 times.    Other exercise may include neck rotation, neck tilting, vertical shoulder stretches, upper back stretches, and back bending.    ?Neck rotation -- Slowly look to the right. Hold for a few seconds. Look to the center. Rest for a few seconds between movements. Repeat 10 to 15 times. Perform on the left side.    ?Neck tilting -- Look straight forward, then tilt the top of the head to the right, trying to touch your right ear to the right shoulder (without moving the shoulder). Hold in place for a few seconds. Return the head to the center. Repeat 10 to 15 times. Repeat on the left side.    ?Vertical shoulder stretches -- In the sitting or standing position, use the right hand to hold the left wrist and pull the arm (and shoulder) up and over the head, towards the right. Hold for five seconds. Keep the left shoulder and back muscles relaxed. Rest and repeat 10 to 15 times. Repeat using left hand to hold right wrist.    ?Upper back stretches -- In the standing position, lean forward from the hips and rest both hands on a low counter with the elbows straight. Exhale, relax the neck and shoulders, and allow the head to fall forward as you round the upper back. This requires the shoulder blades to spread apart and mimics the motion of a cat stretching its back. Exhaling with the motion helps to relax the muscles. Return to the standing position with hands on a counter. Repeat slowly 10 times.    ?Upper back side bends -- Stand or sit up straight in a chair. Bend the trunk to the right while holding the hands together slightly behind the neck for support. Hold for five seconds, and then return to center. Repeat to the left. Repeat 10 to 15 times. Keep the lower back straight or  supported against a chair.    Reduce stress -- Emotional stress can increase neck tension and interfere with or delay the recovery process. Reducing stress may help to prevent a recurrence of neck pain. Relaxation techniques can relieve musculoskeletal tension. An example of a relaxation exercise is to take a deep breath in, hold it for a few seconds, and then exhale completely. Breathe normally for a few seconds, and then repeat.    Other activities that may help to reduce stress include meditation, progressive muscle relaxation, prayer, self-hypnosis, or biofeedback.    Posture -- Activities and body positions that prevent or reduce neck pain include those that emphasize a neutral neck position and minimize tension across the supporting muscles and ligaments of the neck. Extremes of range of motion, activities, and body positions that cause constant tension   should be minimized or avoided:    ?Avoid sitting in the same position for prolonged periods of time. Take periodic five minute breaks from the desk, work station, etc. Avoid looking up or down at a computer monitor; adjust it to eye level.    ?Avoid placing pressure over the upper back with backpacks, over-the-shoulder purses, or children riding on your shoulders. Alternatives may include wheeled backpacks or cases, handbags, and having a child walk or ride in a stroller.    ?Do not perform overhead work for prolonged periods at a time.    ?Maintain good posture by holding your head up and keeping your shoulders back and down.    ?Use the car or chair arm rests to keep the arms supported.    ?Sleep with your neck in a neutral position by sleeping with a small pillow under the nape of your neck (sleeping on your back) or sleeping with enough pillows to keep your neck straight in line with your body (sleeping on your side). If you sleep on your back, putting a pillow under the knees can help to flatten the spine and relax the neck muscles. Avoid sleeping on the  stomach with the head turned.    ?Carry heavy objects close to your body rather than with outstretched arms.

## 2013-06-17 ENCOUNTER — Telehealth: Payer: Self-pay | Admitting: *Deleted

## 2013-06-17 ENCOUNTER — Encounter: Payer: Self-pay | Admitting: Cardiovascular Disease

## 2013-06-17 DIAGNOSIS — I2510 Atherosclerotic heart disease of native coronary artery without angina pectoris: Secondary | ICD-10-CM

## 2013-06-17 NOTE — Telephone Encounter (Signed)
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  Dr Allyson Sabal gave me a verbal order.  Order placed

## 2013-06-24 ENCOUNTER — Ambulatory Visit (HOSPITAL_COMMUNITY)
Admission: RE | Admit: 2013-06-24 | Discharge: 2013-06-24 | Disposition: A | Discharge status: Home / Self Care | Payer: 59 | Source: Ambulatory Visit | Attending: Cardiology | Admitting: Cardiology

## 2013-06-24 DIAGNOSIS — I2510 Atherosclerotic heart disease of native coronary artery without angina pectoris: Secondary | ICD-10-CM

## 2013-06-24 DIAGNOSIS — Z8249 Family history of ischemic heart disease and other diseases of the circulatory system: Secondary | ICD-10-CM | POA: Insufficient documentation

## 2013-06-24 DIAGNOSIS — I252 Old myocardial infarction: Secondary | ICD-10-CM | POA: Insufficient documentation

## 2013-06-24 DIAGNOSIS — I10 Essential (primary) hypertension: Secondary | ICD-10-CM | POA: Insufficient documentation

## 2013-06-24 DIAGNOSIS — Z9861 Coronary angioplasty status: Secondary | ICD-10-CM | POA: Insufficient documentation

## 2013-06-24 MED ORDER — PERFLUTREN LIPID MICROSPHERE: 1.0000 mL | INTRAVENOUS | Status: DC | PRN

## 2013-06-24 MED ORDER — TECHNETIUM TC 99M SESTAMIBI GENERIC - CARDIOLITE
10.8000 | Freq: Once | INTRAVENOUS | Status: AC | PRN
  Administered 2013-06-24: 11 via INTRAVENOUS

## 2013-06-24 MED ORDER — TECHNETIUM TC 99M SESTAMIBI GENERIC - CARDIOLITE
30.3000 | Freq: Once | INTRAVENOUS | Status: AC | PRN
  Administered 2013-06-24: 30.3 via INTRAVENOUS

## 2013-06-24 NOTE — Procedures (Addendum)
[facility] [address] [place] [address] [place] [address] [phone]  Cardiology Nuclear Med Study  Kyle Chavez is a 47 y.o. male     MRN : [id number]     DOB: [birth year]  Procedure Date: 06/24/2013  Nuclear Med Background Indication for Stress Test:  Stent Patency History:  CAD;MI-2010;STENT/PTCA--2010 Cardiac Risk Factors: Family History - CAD, Hypertension and Lipids  Symptoms:  PT DENIES SYMPTOMS   Nuclear Pre-Procedure Caffeine/Decaff Intake:  8:00pm NPO After: 6:00am   IV Site: R Antecubital  IV 0.9% NS with Angio Cath:  22g  Chest Size (in):  40"  IV Started by: Emmit Pomfret, RN  Height: 5\' 6"  (1.676 m)  Cup Size: n/a  BMI:  Body mass index is 23.58 kg/(m^2). Weight:  146 lb (66.225 kg)   Tech Comments:  N/A    Nuclear Med Study 1 or 2 day study: 1 day  Stress Test Type:  Stress  Order Authorizing Provider:  Nanetta Batty, MD   Resting Radionuclide: Technetium 76m Sestamibi  Resting Radionuclide Dose: 10.8 mCi   Stress Radionuclide:  Technetium 53m Sestamibi  Stress Radionuclide Dose: 30.3 mCi           Stress Protocol Rest HR: 102 Stress HR: 150  Rest BP: 121/76 Stress BP: 157/88  Exercise Time (min): 9:30 METS: 10.1   Predicted Max HR: 172 bpm % Max HR: 87.21 bpm Rate Pressure Product: 57846  Dose of Adenosine (mg):  n/a Dose of Lexiscan: n/a mg  Dose of Atropine (mg): n/a Dose of Dobutamine: n/a mcg/kg/min (at max HR)  Stress Test Technologist: Esperanza Sheets, CCT Nuclear Technologist: Gonzella Lex, CNMT   Rest Procedure:  Myocardial perfusion imaging was performed at rest 45 minutes following the intravenous administration of Technetium 49m Sestamibi. Stress Procedure:  The patient performed treadmill exercise using a Bruce  Protocol for 9:30 minutes. The patient stopped due to SOB and denied any chest pain.  There were no significant ST-T wave changes.  Technetium 81m Sestamibi was injected at peak  exercise and myocardial perfusion imaging was performed after a brief delay.  Transient Ischemic Dilatation (Normal <1.22):  0.97 Lung/Heart Ratio (Normal <0.45):  0.25 QGS EDV:  63 ml QGS ESV:  15 ml LV Ejection Fraction: 76% -- small LV volumes lead to overestimation of EF.  Signed by Gonzella Lex, CNMT  PHYSICIAN INTERPRETATION  Rest ECG: NSR with non-specific ST-T wave changes, Possible Inferior MI, Age Undetermined.  Stress ECG: No significant change from baseline ECG and No significant ST segment change suggestive of ischemia.  QPS Raw Data Images:  There is interference from nuclear activity from structures below the diaphragm. This does not affect the ability to read the study. Stress Images:  Normal homogeneous uptake in all areas of the myocardium. Rest Images:  Normal homogeneous uptake in all areas of the myocardium. Subtraction (SDS):  There is no evidence of scar or ischemia.  Impression Exercise Capacity:  Excellent exercise capacity. BP Response:  Hypertensive blood pressure response. Diastolic BP increased ~12 mmHg Clinical Symptoms:  There is dyspnea. ECG Impression:  No significant ST segment change suggestive of ischemia. Comparison with Prior Nuclear Study: No significant change from previous study  Overall Impression:  Normal stress nuclear study. and Low risk stress nuclear study with no scintigraphic evidence of ischemia or infarction..  LV Wall Motion:  Normal Wall Motion   Adilen Pavelko W, MD  06/25/2013 3:02 PM

## 2013-06-26 ENCOUNTER — Encounter: Payer: Self-pay | Admitting: *Deleted

## 2013-07-02 ENCOUNTER — Telehealth: Payer: Self-pay | Admitting: Cardiovascular Disease

## 2013-07-02 NOTE — Telephone Encounter (Signed)
The patient would like to know the results of his stress test. ST.

## 2013-07-02 NOTE — Telephone Encounter (Signed)
The patient called wanting his stress test results. ST  He could not get through to the operator as the machine was full.

## 2013-07-03 ENCOUNTER — Telehealth: Payer: Self-pay | Admitting: Cardiovascular Disease

## 2013-07-03 NOTE — Telephone Encounter (Signed)
Error

## 2013-07-07 ENCOUNTER — Telehealth: Payer: Self-pay | Admitting: Cardiovascular Disease

## 2013-07-07 NOTE — Telephone Encounter (Signed)
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 Marland Kitchen Explain that Dr.  Allyson Sabal will not be back in the office until Wednesday .   Please call .  Thanks

## 2013-07-07 NOTE — Telephone Encounter (Signed)
Message forwarded to K. Vogel, RN.  

## 2013-07-08 NOTE — Telephone Encounter (Signed)
I called and asked that form be refaxed.  I received form and will show to Dr Allyson Sabal tomorrow.

## 2013-07-16 NOTE — Telephone Encounter (Signed)
Letter signed by Dr Allyson Sabal and letter faxed.

## 2013-09-08 ENCOUNTER — Encounter: Payer: Self-pay | Admitting: *Deleted

## 2013-09-10 ENCOUNTER — Encounter: Payer: Self-pay | Admitting: Cardiovascular Disease

## 2013-09-10 ENCOUNTER — Encounter: Payer: Self-pay | Admitting: Cardiology

## 2013-09-10 ENCOUNTER — Ambulatory Visit (INDEPENDENT_AMBULATORY_CARE_PROVIDER_SITE_OTHER): Payer: 59 | Admitting: Cardiology

## 2013-09-10 VITALS — BP 106/78 | HR 64 | Ht 66.0 in | Wt 152.4 lb

## 2013-09-10 DIAGNOSIS — G43909 Migraine, unspecified, not intractable, without status migrainosus: Secondary | ICD-10-CM

## 2013-09-10 DIAGNOSIS — Z9989 Dependence on other enabling machines and devices: Secondary | ICD-10-CM | POA: Insufficient documentation

## 2013-09-10 DIAGNOSIS — G4733 Obstructive sleep apnea (adult) (pediatric): Secondary | ICD-10-CM

## 2013-09-10 DIAGNOSIS — Z1329 Encounter for screening for other suspected endocrine disorder: Secondary | ICD-10-CM

## 2013-09-10 DIAGNOSIS — Z79899 Other long term (current) drug therapy: Secondary | ICD-10-CM

## 2013-09-10 DIAGNOSIS — E785 Hyperlipidemia, unspecified: Secondary | ICD-10-CM

## 2013-09-10 DIAGNOSIS — I10 Essential (primary) hypertension: Secondary | ICD-10-CM

## 2013-09-10 DIAGNOSIS — R61 Generalized hyperhidrosis: Secondary | ICD-10-CM

## 2013-09-10 DIAGNOSIS — I2510 Atherosclerotic heart disease of native coronary artery without angina pectoris: Secondary | ICD-10-CM

## 2013-09-10 NOTE — Assessment & Plan Note (Signed)
Controlled.  

## 2013-09-10 NOTE — Patient Instructions (Addendum)
Call if increased episode of shortness of breath or chest pain.    Exercise!  On your days off.    Eat heart healthy.  Follow up with Dr. Allyson Sabal in 6 months.  Please have some blood work done at your earliest convenience, using a [employer]. (There is one in our building, on the 1st floor - also [address] near [employer]) You will need to be fasting for this blood work.

## 2013-09-10 NOTE — Assessment & Plan Note (Signed)
Followed by PCP.  Is on procardia and Inderal for migraines, and coreg for cardiac.  Stable for many years on these meds.

## 2013-09-10 NOTE — Assessment & Plan Note (Signed)
No labs recently.  Will order NMR with strong family hx of CAD/MIs.  Discussed diet with pt.  And exercise.

## 2013-09-10 NOTE — Assessment & Plan Note (Signed)
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

## 2013-09-10 NOTE — Assessment & Plan Note (Signed)
May be related to OSA but will check TSH.

## 2013-09-10 NOTE — Progress Notes (Signed)
09/10/2013   PCP: Lucila Maine, MD   Chief Complaint  Patient presents with  . 6 months    breaks out in sweat every now and then; occ CP/SOB at rest; oc bilat LE edema; stress test in July    Primary Cardiologist: Dr. Erlene Quan   HPI:  47 year old married white male followed by Dr. Allyson Sabal is here today for 6 month cardiology follow up. Patient has a history of coronary artery disease with two-vessel PCI and stenting via the right radial approach in July of 2010 this was the circumflex artery in his right coronary artery. He did have moderately diffuse disease of the ramus branch of 70% which was treated medically. He does have obstructive sleep apnea on CPAP, he has hypertension hyperlipidemia and a strong family history of heart disease. Recently his father had ICD placed. His father had an MI in his early 87s and his brother had an MI at 11.   His Last cardiac cath was April of 2012 he had patent stents and again 70% stenosis of the ramus branch.  In June of this year he underwent exercise nuclear study which was normal no ischemia and normal LV wall motion. 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  Today he had no significant complaints he does complain he was stressed about his mother he was diagnosed with stomach cancer her and walking up steps he was short of breath but improved with rest he also occasional chest discomfort usually goes away with rest he has not taken any nitroglycerin recently but he does carry them.  He does not exercise he drives 5 days a week and has 2 days off we discussed importance of walking at least on his days off. Also discussed his diet he tries not to eat fast food occasionally this happens and he gets some lower extremity edema that resolves quickly.  He was unsure when his last cholesterol check was. Patient is on carvedilol as well as Inderal is on Inderal and Procardia for his migraines which are fairly well controlled. He's been on this  combination for several years.   No Known Allergies  Current Outpatient Prescriptions  Medication Sig Dispense Refill  . aspirin 81 MG chewable tablet Chew 81 mg by mouth daily.      . carvedilol (COREG) 12.5 MG tablet Take 12.5 mg by mouth 2 (two) times daily with a meal.      . clopidogrel (PLAVIX) 75 MG tablet Take 75 mg by mouth daily.      . famotidine (PEPCID) 40 MG tablet Take 40 mg by mouth daily.      Marland Kitchen gabapentin (NEURONTIN) 100 MG capsule Take 200 mg by mouth at bedtime.       . isosorbide mononitrate (IMDUR) 60 MG 24 hr tablet Take 60 mg by mouth daily.      Marland Kitchen losartan (COZAAR) 50 MG tablet Take 50 mg by mouth daily.      . Multiple Vitamin (MULTIVITAMIN) tablet Take 1 tablet by mouth daily.      Marland Kitchen NIFEdipine (PROCARDIA XL/ADALAT-CC) 30 MG 24 hr tablet Take 30 mg by mouth daily.      . nitroGLYCERIN (NITROSTAT) 0.4 MG SL tablet Place 0.4 mg under the tongue every 5 (five) minutes as needed for chest pain.      . pravastatin (PRAVACHOL) 20 MG tablet Take 20 mg by mouth daily.      . propranolol (INNOPRAN XL) 80 MG 24  hr capsule Take 80 mg by mouth at bedtime. Patient also takes coreg 12.5mg  per patient.      . topiramate (TOPAMAX) 25 MG capsule Take 25 mg by mouth at bedtime.       No current facility-administered medications for this visit.    Past Medical History  Diagnosis Date  . Hypertension   . Dyslipidemia   . Myocardial infarction 2010  . Angina   . Migraine 03/05/12    "still have them every now and then"  . CAD (coronary artery disease)   . OSA on CPAP   . Family history of heart disease   . ED (erectile dysfunction)   . History of nuclear stress test 04/18/2011    exercise myoview; normal pattern of perfusion in all regions; low risk scan     Past Surgical History  Procedure Laterality Date  . Rt arm surgery  1960's    "caught in washing machine; have had 2 skin grafts there"  . Coronary angioplasty with stent placement  07/09/2009    PCI & stenting  with Xience DES of circumflex & RCA  . Cardiac catheterization  03/05/12    patent stents, diffusely disease ramus branch  . Tonsillectomy and adenoidectomy      "when I was a kid"  . Cholecystectomy  ?1990's  . Laceration repair  ~ 2009    right thumb    ZOX:WRUEAVW:UJ colds or fevers, no weight changes Skin:no rashes or ulcers HEENT:no blurred vision, no congestion, no recent headaches CV:see HPI PUL:see HPI GI:no diarrhea ,constipation or melena, no indigestion Colonoscopy  Done this year, polyps removed GU:+ hematuria on urine dipstick, workup not yet done, no dysuria--  MS:no joint pain, no claudication Neuro:no syncope, no lightheadedness Endo:no diabetes, no thyroid disease  PHYSICAL EXAM BP 106/78  Pulse 64  Ht 5\' 6"  (1.676 m)  Wt 152 lb 6.4 oz (69.128 kg)  BMI 24.61 kg/m2 General:Pleasant affect, NAD Skin:Warm and dry, brisk capillary refill HEENT:normocephalic, sclera clear, mucus membranes moist Neck:supple, no JVD, no bruits  Heart:S1S2 RRR without murmur, gallup, rub or click Lungs:clear without rales, rhonchi, or wheezes WJX:BJYN, non tender, + BS, do not palpate liver spleen or masses Ext:no lower ext edema, 2+ pedal pulses, 2+ radial pulses Neuro:alert and oriented, MAE, follows commands, + facial symmetry  EKG:SR no acute changes from previous tracings  ASSESSMENT AND PLAN CAD, staged PCI with DES 7/10, (PLA and CFX), relook, 2 OK, Nuc low risk 4/12 Stable angina, rare, last cath with patent stents and 70% Ramus stenosis.  Dyslipidemia No labs recently.  Will order NMR with strong family hx of CAD/MIs.  Discussed diet with pt.  And exercise.  HTN (hypertension) Controlled.  Migraines Followed by PCP.  Is on procardia and Inderal for migraines, and coreg for cardiac.  Stable for many years on these meds.  OSA on CPAP .  Night sweats May be related to OSA but will check TSH.   Follow up with Dr. Allyson Sabal in 6 months unless problems occur. Will  also check TSH for complaints of sweating at night.

## 2013-12-22 ENCOUNTER — Telehealth: Payer: Self-pay | Admitting: Cardiovascular Disease

## 2013-12-22 NOTE — Telephone Encounter (Signed)
FYI - sent message to [facility] to set up OV

## 2013-12-22 NOTE — Telephone Encounter (Signed)
JB noted. Office visit with Dr Allyson Sabal for 01/20/14

## 2013-12-22 NOTE — Telephone Encounter (Signed)
Had Cath on last Monday at [facility].He wanted Dr Allyson Sabal to know that and want to have a follow up appt with him.

## 2014-01-20 ENCOUNTER — Ambulatory Visit (INDEPENDENT_AMBULATORY_CARE_PROVIDER_SITE_OTHER): Payer: 59 | Admitting: Cardiovascular Disease

## 2014-01-20 ENCOUNTER — Encounter: Payer: Self-pay | Admitting: Cardiovascular Disease

## 2014-01-20 VITALS — BP 128/82 | HR 68 | Ht 66.0 in | Wt 146.1 lb

## 2014-01-20 DIAGNOSIS — I10 Essential (primary) hypertension: Secondary | ICD-10-CM

## 2014-01-20 DIAGNOSIS — I2510 Atherosclerotic heart disease of native coronary artery without angina pectoris: Secondary | ICD-10-CM

## 2014-01-20 NOTE — Progress Notes (Signed)
Patient ID: Kyle Chavez, male   DOB: 06-Aug-1966, 48 y.o.   MRN: [id number]  Chief Complaint  Patient presents with  . Follow-up    follow-up, patient stated he had a heart cath at [facility], the second week of december    HPI Kyle Chavez is a 48 y.o. male who presents for 6 months follow up.   Patient has a history of coronary artery disease with two-vessel PCI and stenting via the right radial approach in July of 2010 this was the circumflex artery in his right coronary artery. He did have moderately diffuse disease of the ramus branch of 70% which was treated medically. He does have obstructive sleep apnea on CPAP, he has hypertension, hyperlipidemia and a strong family history of heart disease. Recently his father had ICD placed. His father had an MI in his early 81s, his paternal grandfather died of an MI at 35yo and his maternal grandfather died of an MI at 32yo.  He was catheterized by Dr. Herbie Baltimore on April 4th 2012 revealing patent stents and a diffusely diseased ramus branch, unchanged from previous. He was then catheterized by Dr. Allyson Sabal on March 05 2012 which showed similar anatomy.  In June 2014 he underwent exercise nuclear study which was normal no ischemia and normal LV wall motion. 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  Patient reports that in December 2014, he started having chest pressure radiating to his left arm and jaw, lasting up to 30 minutes and recurring after nitroglycerin. This was prompted by exertion but did not fully subside at rest. He went to [facility] where he was transferred to [facility] for cardiac cath despite patient's request to be transferred to [facility] where his cardiologist group is. Patient reportedly underwent cath which did not show anything new or acute. He was discharged home without any medication change.   Since then, he reports having occasional chest pressure, lasting a few minutes and resolving on its own, not  requiring nitro. Pain occurs with exertion, not associated with shortness of breath, palpitations, nausea, diaphoresis or radiation to the arm and jaw. These episodes occur a couple of times per week.  He reports some dyspnea with going up a flight of stairs. He denies any lower extremity swelling. No orthopnea.  He wears his CPAP machine intermittently when he doesn't forget to wear it. He doesn't exercise. 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 He has never smoked. He is compliant with his medications.    Past Medical History  Diagnosis Date  . Hypertension   . Dyslipidemia   . Myocardial infarction 2010  . Angina   . Migraine 03/05/12    "still have them every now and then"  . CAD (coronary artery disease)   . OSA on CPAP   . Family history of heart disease   . ED (erectile dysfunction)   . History of nuclear stress test 04/18/2011    exercise myoview; normal pattern of perfusion in all regions; low risk scan     Past Surgical History  Procedure Laterality Date  . Rt arm surgery  1960's    "caught in washing machine; have had 2 skin grafts there"  . Coronary angioplasty with stent placement  07/09/2009    PCI & stenting with Xience DES of circumflex & RCA  . Cardiac catheterization  03/05/12    patent stents, diffusely disease ramus branch  . Tonsillectomy and  adenoidectomy      "when I was a kid"  . Cholecystectomy  ?1990's  . Laceration repair  ~ 2009    right thumb    Family History  Problem Relation Age of Onset  . Coronary artery disease Father     MI at 26, CABG  . Heart attack Father 83    now has ICD  . Coronary artery disease Brother     MI in 30s  . Heart attack Brother 30  . Heart attack Maternal Grandfather     in 10s  . Heart disease Other     CABG in 40s  . Stroke Maternal Grandmother   . Heart disease Paternal Grandmother     CABGs in 85s  . Cancer Mother   . Healthy Sister   . Healthy Sister     Social  History History  Substance Use Topics  . Smoking status: Never Smoker   . Smokeless tobacco: Never Used  . Alcohol Use: No     Comment: "quit drinking ~ 2010"    No Known Allergies  Current Outpatient Prescriptions  Medication Sig Dispense Refill  . aspirin 81 MG chewable tablet Chew 81 mg by mouth daily.      . carvedilol (COREG) 12.5 MG tablet Take 12.5 mg by mouth 2 (two) times daily with a meal.      . clopidogrel (PLAVIX) 75 MG tablet Take 75 mg by mouth daily.      . famotidine (PEPCID) 40 MG tablet Take 40 mg by mouth daily.      Marland Kitchen gabapentin (NEURONTIN) 100 MG capsule Take 200 mg by mouth at bedtime.       . isosorbide mononitrate (IMDUR) 60 MG 24 hr tablet Take 60 mg by mouth daily.      Marland Kitchen losartan (COZAAR) 50 MG tablet Take 50 mg by mouth daily.      . Multiple Vitamin (MULTIVITAMIN) tablet Take 1 tablet by mouth daily.      Marland Kitchen NIFEdipine (PROCARDIA XL/ADALAT-CC) 30 MG 24 hr tablet Take 30 mg by mouth daily.      . nitroGLYCERIN (NITROSTAT) 0.4 MG SL tablet Place 0.4 mg under the tongue every 5 (five) minutes as needed for chest pain.      . pravastatin (PRAVACHOL) 20 MG tablet Take 20 mg by mouth daily.      . propranolol (INNOPRAN XL) 80 MG 24 hr capsule Take 80 mg by mouth at bedtime. Patient also takes coreg 12.5mg  per patient.      . topiramate (TOPAMAX) 25 MG capsule Take 25 mg by mouth at bedtime.       No current facility-administered medications for this visit.    Review of Systems Review of Systems Negative except per HPI Blood pressure 128/82, pulse 68, height 5\' 6"  (1.676 m), weight 146 lb 1.6 oz (66.271 kg).  Physical Exam Physical Exam General: no acute distress, well appearing, thin, white male HEENT: EOMI, no JVD, no carotid bruit appreciated CV: S1S2, RRR, no murmur or gallop Pulm: clear to auscultation bilaterally, normal work of breathing, no crackles or wheezing Abdomen: soft, non tender, non distended, present bowel sounds Extr: no pedal edema,  strong radial pulse bilaterally  Data Reviewed EKG: normal sinus rhythm with rate of 68 bpm Normal PR interval, QRS, QTc, no ST changes  Assessment    Kyle Chavez is a 48 y.o. male with h/o CAD, HTN, dyslipidemia, strong family history of ischemic cardiac disease who presents for 6 months  follow up.     Plan    1. CAD, staged PCI with DES 7/10, (RCA and CFX) with chronically diseased small to medium sized ramus branch, relook, 2 OK, Nuc low risk 4/12:  - recently underwent cardiac cath in December 2014 at [facility]. We have not yet obtained records of procedure.  - since his episode of chest pain in December, patient has intermittent chest discomfort not requiring nitro. Discussed the option of starting ranexa but patient thinks this would be too expensive and feels like the chest discomfort doesn't limit his daily activities.  - continue current medical therapy - encouraged daily exercise - follow up with NP or PA in 6 months and with Dr. Allyson SabalBerry in 1 year  2. Hyperlipidemia: had lipids checked by Dr. Lorin PicketScott in November - will request lab results - continue pravastatin 20mg  daily unless lipid profile significantly elevated.   3. Hypertension: well controled.  - continue current therapy  4. OSA on CPAP: intermittently compliant with CPAP. - encouraged nightly use  5. Migraine headaches: followed by PCP        Gwenlyn SaranLOSQ, Ceilidh Torregrossa 01/20/2014, 10:01 AM Family Medicine Resident PGY-3  I agree with the assessment and plan type Dr. Gwenlyn SaranLosq  Kyle Chavez has a history of known CAD status post stenting of his AV groove circumflex and distal right coronary artery with a known moderately diseased small ramus branch. He has chronic angina on multiple anti-angina medications. [facility] he underwent cardiac catheterization revealing unchanged anatomy. Continue medical therapy was recommended. His exam is benign and his  EKG shows no acute changes. We'll obtain his most recent lipid profile I will see him back in one year with a six-month mid-level provider office as a   Kyle Chavez, M.D., FACP, [facility]ACC, Earl LagosFAHA, [facility]SCAI [facility] [address] [place], [place]  [id number]  [phone] 01/20/2014 10:52 AM

## 2014-01-20 NOTE — Patient Instructions (Signed)
Your physician recommends that you schedule a follow-up appointment in: 6 months with PA  Your physician recommends that you schedule a follow-up appointment in: 1 year with Dr Allyson SabalBerry

## 2014-03-09 ENCOUNTER — Other Ambulatory Visit: Payer: Self-pay | Admitting: *Deleted

## 2014-03-09 MED ORDER — CARVEDILOL 12.5 MG PO TABS
12.5000 mg | ORAL_TABLET | Freq: Two times a day (BID) | ORAL | Status: DC
Start: 2014-03-09 — End: 2015-01-05

## 2014-03-09 MED ORDER — LOSARTAN POTASSIUM 50 MG PO TABS: 50.0000 mg | ORAL_TABLET | Freq: Every day | ORAL | Status: DC

## 2014-03-09 MED ORDER — NIFEDIPINE ER OSMOTIC RELEASE 30 MG PO TB24: 30.0000 mg | ORAL_TABLET | Freq: Every day | ORAL | Status: DC

## 2014-03-09 MED ORDER — FAMOTIDINE 40 MG PO TABS: 40.0000 mg | ORAL_TABLET | Freq: Every day | ORAL | Status: DC

## 2014-03-09 MED ORDER — CLOPIDOGREL BISULFATE 75 MG PO TABS: 75.0000 mg | ORAL_TABLET | Freq: Every day | ORAL | Status: DC

## 2014-03-09 MED ORDER — ISOSORBIDE MONONITRATE ER 60 MG PO TB24: 60.0000 mg | ORAL_TABLET | Freq: Every day | ORAL | Status: DC

## 2014-03-09 NOTE — Telephone Encounter (Signed)
Rx was sent to pharmacy electronically. 

## 2014-03-19 ENCOUNTER — Other Ambulatory Visit: Payer: Self-pay | Admitting: *Deleted

## 2014-03-19 MED ORDER — PRAVASTATIN SODIUM 20 MG PO TABS: 20.0000 mg | ORAL_TABLET | Freq: Every day | ORAL | Status: DC

## 2014-03-19 NOTE — Telephone Encounter (Signed)
Rx was sent to pharmacy electronically. 

## 2014-09-05 ENCOUNTER — Other Ambulatory Visit: Payer: Self-pay | Admitting: Cardiovascular Disease

## 2014-09-07 NOTE — Telephone Encounter (Signed)
Rx was sent to pharmacy electronically. 

## 2014-12-03 ENCOUNTER — Encounter (HOSPITAL_COMMUNITY): Payer: Self-pay | Admitting: Cardiovascular Disease

## 2014-12-31 ENCOUNTER — Other Ambulatory Visit: Payer: Self-pay | Admitting: Cardiovascular Disease

## 2015-01-05 ENCOUNTER — Other Ambulatory Visit: Payer: Self-pay | Admitting: Cardiovascular Disease

## 2015-01-07 ENCOUNTER — Encounter (HOSPITAL_COMMUNITY): Payer: Self-pay | Admitting: Cardiovascular Disease

## 2015-01-08 ENCOUNTER — Other Ambulatory Visit: Payer: Self-pay | Admitting: Cardiovascular Disease

## 2015-01-09 ENCOUNTER — Other Ambulatory Visit: Payer: Self-pay | Admitting: Cardiovascular Disease

## 2015-01-28 ENCOUNTER — Other Ambulatory Visit: Payer: Self-pay | Admitting: Cardiovascular Disease

## 2015-01-28 NOTE — Telephone Encounter (Signed)
Rx(s) sent to pharmacy electronically. Message sent to scheduler Billie to contact patient for appointment.

## 2015-02-02 ENCOUNTER — Telehealth: Payer: Self-pay | Admitting: Cardiovascular Disease

## 2015-02-02 ENCOUNTER — Other Ambulatory Visit: Payer: Self-pay | Admitting: Cardiovascular Disease

## 2015-02-02 NOTE — Telephone Encounter (Signed)
Rx(s) sent to pharmacy electronically.  

## 2015-02-03 NOTE — Telephone Encounter (Signed)
Closed encounter °

## 2015-02-24 ENCOUNTER — Other Ambulatory Visit: Payer: Self-pay | Admitting: Cardiovascular Disease

## 2015-02-24 NOTE — Telephone Encounter (Signed)
Rx has been sent to the pharmacy electronically. ° °

## 2015-03-09 ENCOUNTER — Other Ambulatory Visit: Payer: Self-pay | Admitting: Cardiovascular Disease

## 2015-03-09 NOTE — Telephone Encounter (Signed)
E SENT TO PHARMACY 

## 2015-03-17 ENCOUNTER — Encounter: Payer: Self-pay | Admitting: Cardiovascular Disease

## 2015-03-17 ENCOUNTER — Ambulatory Visit (INDEPENDENT_AMBULATORY_CARE_PROVIDER_SITE_OTHER): Payer: 59 | Admitting: Cardiovascular Disease

## 2015-03-17 VITALS — BP 110/80 | HR 71 | Ht 65.0 in | Wt 142.0 lb

## 2015-03-17 DIAGNOSIS — I10 Essential (primary) hypertension: Secondary | ICD-10-CM

## 2015-03-17 DIAGNOSIS — E785 Hyperlipidemia, unspecified: Secondary | ICD-10-CM

## 2015-03-17 DIAGNOSIS — I2510 Atherosclerotic heart disease of native coronary artery without angina pectoris: Secondary | ICD-10-CM | POA: Diagnosis not present

## 2015-03-17 DIAGNOSIS — I2583 Coronary atherosclerosis due to lipid rich plaque: Secondary | ICD-10-CM

## 2015-03-17 MED ORDER — CARVEDILOL 12.5 MG PO TABS: ORAL_TABLET | ORAL | Status: DC

## 2015-03-17 MED ORDER — CLOPIDOGREL BISULFATE 75 MG PO TABS: 75.0000 mg | ORAL_TABLET | Freq: Every day | ORAL | Status: DC

## 2015-03-17 MED ORDER — ISOSORBIDE MONONITRATE ER 60 MG PO TB24: 60.0000 mg | ORAL_TABLET | Freq: Every day | ORAL | Status: DC

## 2015-03-17 MED ORDER — LOSARTAN POTASSIUM 50 MG PO TABS: 50.0000 mg | ORAL_TABLET | Freq: Every day | ORAL | Status: DC

## 2015-03-17 MED ORDER — PRAVASTATIN SODIUM 20 MG PO TABS: ORAL_TABLET | ORAL | Status: DC

## 2015-03-17 NOTE — Patient Instructions (Signed)
Your physician wants you to follow-up in: 1 Year You will receive a reminder letter in the mail two months in advance. If you don't receive a letter, please call our office to schedule the follow-up appointment.  Your physician has requested that you have en exercise stress myoview. For further information please visit [email or web address]. Please follow instruction sheet, as given.

## 2015-03-17 NOTE — Assessment & Plan Note (Signed)
History of CAD status post 2 vessel PCI and stenting via the right radial approach July 2010 of the circumflex and right coronary artery. I performed carotid catheterization on him 03/05/12 revealing patent stents. A Myoview stress test performed 06/25/13 was nonischemic. He had cardiac catheterization hi 0.8 2014 revealing unchanged anatomy. He gets occasional atypical chest pain.

## 2015-03-17 NOTE — Assessment & Plan Note (Signed)
History of hypertension with blood pressure measured at 110/80. He is on carvedilol, losartan, nifedipine and propranolol. Continue current meds at current dosage

## 2015-03-17 NOTE — Assessment & Plan Note (Signed)
History of hyperlipidemia on pravastatin 20 mg a day. We will recheck a lipid and liver profile

## 2015-03-17 NOTE — Progress Notes (Signed)
03/17/2015 Kyle Chavez   [birth year]  [id number]  Primary Physician Lucila MaineSCOTT, ROBERT, MD Primary Cardiologist: Runell GessJonathan J. Noheli Melder MD MasticFACP,FACC,FAHA, FSCAI   HPI:  Kyle Chavez is a 49y.o. male who presents for 12 months follow up.   Patient has a history of coronary artery disease with two-vessel PCI and stenting via the right radial approach in July of 2010 this was the circumflex artery in his right coronary artery. He did have moderately diffuse disease of the ramus branch of 70% which was treated medically. He does have obstructive sleep apnea on CPAP, he has hypertension, hyperlipidemia and a strong family history of heart disease. Recently his father had ICD placed. His father had an MI in his early 7340s, his paternal grandfather died of an MI at 51yo and his maternal grandfather died of an MI at 51yo.  He was catheterized by Dr. Herbie BaltimoreHarding on April 4th 2012 revealing patent stents and a diffusely diseased ramus branch, unchanged from previous. He was then catheterized by Dr. Allyson SabalBerry on March 05 2012 which showed similar anatomy.  In June 2014 he underwent exercise nuclear study which was normal no ischemia and normal LV wall motion. 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  Patient reports that in December 2014, he started having chest pressure radiating to his left arm and jaw, lasting up to 30 minutes and recurring after nitroglycerin. This was prompted by exertion but did not fully subside at rest. [facility] despite patient's request to be transferred [facility] where his cardiologist group is. Patient reportedly underwent cath which did not show anything new or acute. He was discharged home without any medication change.   Since then, he reports having occasional chest pressure, lasting a few minutes and resolving on its own, not requiring nitro. Pain occurs with exertion, not associated with  shortness of breath, palpitations, nausea, diaphoresis or radiation to the arm and jaw. These episodes occur a couple of times per week.  He reports some dyspnea with going up a flight of stairs. He denies any lower extremity swelling. No orthopnea.  He wears his CPAP machine intermittently when he doesn't forget to wear it. He doesn't exercise. 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 He has never smoked. He is compliant with his medications.   Since he was seen a year ago he's had occasional atypical chest pain which has not changed in frequency or severity.    Current Outpatient Prescriptions  Medication Sig Dispense Refill  . aspirin 81 MG chewable tablet Chew 81 mg by mouth daily.    . carvedilol (COREG) 12.5 MG tablet TAKE 1 TABLET BY MOUTH 2 TIMES DAILY WITH A MEAL. 60 tablet 11  . clopidogrel (PLAVIX) 75 MG tablet Take 1 tablet (75 mg total) by mouth daily. 30 tablet 11  . famotidine (PEPCID) 40 MG tablet Take 1 tablet (40 mg total) by mouth daily. NEED APPT FOR FUTURE REFILLS 30 tablet 9  . fexofenadine (ALLEGRA) 180 MG tablet Take 180 mg by mouth daily.    Marland Kitchen. gabapentin (NEURONTIN) 300 MG capsule Take 300 mg by mouth at bedtime.    Marland Kitchen. HYDROcodone-acetaminophen (NORCO) 7.5-325 MG per tablet as needed.  0  . isosorbide mononitrate (IMDUR) 60 MG 24 hr tablet Take 1 tablet (60 mg total) by mouth daily. 30 tablet 11  . losartan (COZAAR) 50  MG tablet Take 1 tablet (50 mg total) by mouth daily. 30 tablet 11  . Multiple Vitamin (MULTIVITAMIN) tablet Take 1 tablet by mouth daily.    Marland Kitchen NIFEdipine (PROCARDIA-XL/ADALAT-CC/NIFEDICAL-XL) 30 MG 24 hr tablet TAKE 1 TABLET (30 MG TOTAL) BY MOUTH DAILY. 30 tablet 0  . nitroGLYCERIN (NITROSTAT) 0.4 MG SL tablet Place 0.4 mg under the tongue every 5 (five) minutes as needed for chest pain.    Marland Kitchen ondansetron (ZOFRAN) 8 MG tablet   0  . pravastatin (PRAVACHOL) 20 MG tablet TAKE 1 TABLET (20 MG TOTAL) BY MOUTH DAILY. 30  tablet 11  . propranolol ER (INDERAL LA) 80 MG 24 hr capsule Take 80 mg by mouth daily.  12  . topiramate (TOPAMAX) 100 MG tablet Take 100 mg by mouth 2 (two) times daily.     No current facility-administered medications for this visit.    No Known Allergies  History   Social History  . Marital Status: Married    Spouse Name: N/A  . Number of Children: 1  . Years of Education: N/A   Occupational History  . 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     Social History Main Topics  . Smoking status: Never Smoker   . Smokeless tobacco: Never Used  . Alcohol Use: No     Comment: "quit drinking ~ 2010"  . Drug Use: No  . Sexual Activity: Not Currently   Other Topics Concern  . Not on file   Social History Narrative      Pt lives at home with his spouse.   Caffeine Use- Drinks soda daily     Review of Systems: General: negative for chills, fever, night sweats or weight changes.  Cardiovascular: negative for chest pain, dyspnea on exertion, edema, orthopnea, palpitations, paroxysmal nocturnal dyspnea or shortness of breath Dermatological: negative for rash Respiratory: negative for cough or wheezing Urologic: negative for hematuria Abdominal: negative for nausea, vomiting, diarrhea, bright red blood per rectum, melena, or hematemesis Neurologic: negative for visual changes, syncope, or dizziness All other systems reviewed and are otherwise negative except as noted above.    Blood pressure 110/80, pulse 71, height  (1.651 m), weight 142 lb (64.411 kg).  General appearance: alert and no distress Neck: no adenopathy, no carotid bruit, no JVD, supple, symmetrical, trachea midline and thyroid not enlarged, symmetric, no tenderness/mass/nodules Lungs: clear to auscultation bilaterally Heart: regular rate and rhythm, S1, S2 normal, no murmur, click, rub or gallop Extremities: extremities normal, atraumatic, no cyanosis or edema  EKG normal sinus rhythm at 71 without ST or  T-wave changes. I personally reviewed this EKG  ASSESSMENT AND PLAN:   HTN (hypertension) History of hypertension with blood pressure measured at 110/80. He is on carvedilol, losartan, nifedipine and propranolol. Continue current meds at current dosage   Dyslipidemia History of hyperlipidemia on pravastatin 20 mg a day. We will recheck a lipid and liver profile   CAD, staged PCI with DES 7/10, (PLA and CFX), relook, 2 OK, Nuc low risk 4/12 History of CAD status post 2 vessel PCI and stenting via the right radial approach July 2010 of the circumflex and right coronary artery. I performed carotid catheterization on him 03/05/12 revealing patent stents. A Myoview stress test performed 06/25/13 was nonischemic. He had cardiac catheterization hi 0.8 2014 revealing unchanged anatomy. He gets occasional atypical chest pain.       Runell Gess MD FACP,FACC,FAHA, [facility] 03/17/2015 3:23 PM

## 2015-04-30 ENCOUNTER — Telehealth (HOSPITAL_COMMUNITY): Payer: Self-pay

## 2015-04-30 NOTE — Telephone Encounter (Signed)
Encounter complete. 

## 2015-05-04 ENCOUNTER — Encounter (HOSPITAL_COMMUNITY): Payer: 59

## 2015-05-04 ENCOUNTER — Ambulatory Visit (HOSPITAL_COMMUNITY)
Admission: RE | Admit: 2015-05-04 | Discharge: 2015-05-04 | Disposition: A | Discharge status: Home / Self Care | Payer: 59 | Source: Ambulatory Visit | Attending: Cardiology | Admitting: Cardiology

## 2015-05-04 DIAGNOSIS — I10 Essential (primary) hypertension: Secondary | ICD-10-CM | POA: Insufficient documentation

## 2015-05-04 MED ORDER — TECHNETIUM TC 99M SESTAMIBI GENERIC - CARDIOLITE
30.1000 | Freq: Once | INTRAVENOUS | Status: AC | PRN
  Administered 2015-05-04: 30.1 via INTRAVENOUS

## 2015-05-04 MED ORDER — TECHNETIUM TC 99M SESTAMIBI GENERIC - CARDIOLITE
10.9000 | Freq: Once | INTRAVENOUS | Status: AC | PRN
  Administered 2015-05-04: 10.9 via INTRAVENOUS

## 2015-05-05 LAB — MYOCARDIAL PERFUSION IMAGING
CHL CUP MPHR: 170 {beats}/min
CSEPEW: 11.2 METS
Exercise duration (min): 11 min
LV dias vol: 93 mL
LV sys vol: 37 mL
Nuc Stress EF: 60 %
Peak HR: 150 {beats}/min
Percent HR: 88 %
RPE: 25604
Rest HR: 64 {beats}/min
SDS: 2
SRS: 0
SSS: 2
TID: 1.01

## 2015-12-13 ENCOUNTER — Other Ambulatory Visit: Payer: Self-pay | Admitting: Cardiovascular Disease

## 2015-12-13 MED ORDER — NIFEDIPINE ER OSMOTIC RELEASE 30 MG PO TB24: 30.0000 mg | ORAL_TABLET | Freq: Every day | ORAL | Status: DC

## 2015-12-13 NOTE — Telephone Encounter (Signed)
Rx(s) sent to pharmacy electronically.   I don't see where this medication has been refilled/denied recently. However, he is over due for a yearly appointment with Dr Allyson SabalBerry, last office visit 02/2015. Patient must schedule appointment for any future refills.

## 2015-12-13 NOTE — Telephone Encounter (Signed)
Pt wants to know why his Nifedipine ER was denied? He says if he can get enough for this month,he will make an appointment for first available appointment.

## 2016-01-12 ENCOUNTER — Other Ambulatory Visit: Payer: Self-pay | Admitting: Cardiovascular Disease

## 2016-01-12 NOTE — Telephone Encounter (Signed)
Rx request sent to pharmacy.  

## 2016-02-22 ENCOUNTER — Emergency Department (HOSPITAL_BASED_OUTPATIENT_CLINIC_OR_DEPARTMENT_OTHER): Payer: 59

## 2016-02-22 ENCOUNTER — Emergency Department (HOSPITAL_COMMUNITY): Payer: 59

## 2016-02-22 ENCOUNTER — Observation Stay (HOSPITAL_COMMUNITY)
Admission: EM | Admit: 2016-02-22 | Discharge: 2016-02-23 | Disposition: A | Discharge status: Home / Self Care | Payer: 59 | Attending: Cardiovascular Disease | Admitting: Cardiovascular Disease

## 2016-02-22 ENCOUNTER — Encounter (HOSPITAL_COMMUNITY): Payer: Self-pay | Admitting: Emergency Medicine

## 2016-02-22 DIAGNOSIS — R079 Chest pain, unspecified: Secondary | ICD-10-CM | POA: Diagnosis not present

## 2016-02-22 DIAGNOSIS — I25110 Atherosclerotic heart disease of native coronary artery with unstable angina pectoris: Secondary | ICD-10-CM | POA: Diagnosis not present

## 2016-02-22 DIAGNOSIS — Z9889 Other specified postprocedural states: Secondary | ICD-10-CM | POA: Insufficient documentation

## 2016-02-22 DIAGNOSIS — Z7982 Long term (current) use of aspirin: Secondary | ICD-10-CM | POA: Diagnosis not present

## 2016-02-22 DIAGNOSIS — I10 Essential (primary) hypertension: Secondary | ICD-10-CM | POA: Diagnosis not present

## 2016-02-22 DIAGNOSIS — G43909 Migraine, unspecified, not intractable, without status migrainosus: Secondary | ICD-10-CM | POA: Diagnosis not present

## 2016-02-22 DIAGNOSIS — G4733 Obstructive sleep apnea (adult) (pediatric): Secondary | ICD-10-CM | POA: Diagnosis present

## 2016-02-22 DIAGNOSIS — I2510 Atherosclerotic heart disease of native coronary artery without angina pectoris: Secondary | ICD-10-CM

## 2016-02-22 DIAGNOSIS — Z79899 Other long term (current) drug therapy: Secondary | ICD-10-CM | POA: Insufficient documentation

## 2016-02-22 DIAGNOSIS — R110 Nausea: Secondary | ICD-10-CM | POA: Diagnosis not present

## 2016-02-22 DIAGNOSIS — Z9989 Dependence on other enabling machines and devices: Secondary | ICD-10-CM | POA: Diagnosis present

## 2016-02-22 DIAGNOSIS — I200 Unstable angina: Secondary | ICD-10-CM

## 2016-02-22 DIAGNOSIS — E785 Hyperlipidemia, unspecified: Secondary | ICD-10-CM | POA: Diagnosis not present

## 2016-02-22 DIAGNOSIS — I252 Old myocardial infarction: Secondary | ICD-10-CM | POA: Diagnosis not present

## 2016-02-22 DIAGNOSIS — Z23 Encounter for immunization: Secondary | ICD-10-CM | POA: Insufficient documentation

## 2016-02-22 DIAGNOSIS — R61 Generalized hyperhidrosis: Secondary | ICD-10-CM | POA: Diagnosis not present

## 2016-02-22 DIAGNOSIS — N529 Male erectile dysfunction, unspecified: Secondary | ICD-10-CM | POA: Insufficient documentation

## 2016-02-22 DIAGNOSIS — Z7902 Long term (current) use of antithrombotics/antiplatelets: Secondary | ICD-10-CM | POA: Diagnosis not present

## 2016-02-22 DIAGNOSIS — R0789 Other chest pain: Secondary | ICD-10-CM | POA: Diagnosis present

## 2016-02-22 DIAGNOSIS — Z9861 Coronary angioplasty status: Secondary | ICD-10-CM

## 2016-02-22 HISTORY — DX: Gastro-esophageal reflux disease without esophagitis: K21.9

## 2016-02-22 LAB — BASIC METABOLIC PANEL
Anion gap: 8 (ref 5–15)
BUN: 19 mg/dL (ref 6–20)
CO2: 25 mmol/L (ref 22–32)
CREATININE: 0.87 mg/dL (ref 0.61–1.24)
Calcium: 9.3 mg/dL (ref 8.9–10.3)
Chloride: 107 mmol/L (ref 101–111)
Glucose, Bld: 120 mg/dL — ABNORMAL HIGH (ref 65–99)
Potassium: 5.1 mmol/L (ref 3.5–5.1)
SODIUM: 140 mmol/L (ref 135–145)

## 2016-02-22 LAB — CBC
HCT: 43.8 % (ref 39.0–52.0)
HEMOGLOBIN: 14.2 g/dL (ref 13.0–17.0)
MCH: 27.7 pg (ref 26.0–34.0)
MCHC: 32.4 g/dL (ref 30.0–36.0)
MCV: 85.5 fL (ref 78.0–100.0)
Platelets: 251 10*3/uL (ref 150–400)
RBC: 5.12 MIL/uL (ref 4.22–5.81)
RDW: 14.6 % (ref 11.5–15.5)
WBC: 7.5 10*3/uL (ref 4.0–10.5)

## 2016-02-22 LAB — TROPONIN I
Troponin I: <0.03 ng/mL (ref <0.031)
Troponin I: <0.03 ng/mL (ref <0.031)
Troponin I: <0.03 ng/mL (ref <0.031)

## 2016-02-22 LAB — HEPARIN LEVEL (UNFRACTIONATED): Heparin Unfractionated: 0.27 IU/mL — ABNORMAL LOW (ref 0.30–0.70)

## 2016-02-22 LAB — I-STAT TROPONIN, ED: Troponin i, poc: 0 ng/mL (ref 0.00–0.08)

## 2016-02-22 LAB — MRSA PCR SCREENING: MRSA by PCR: NEGATIVE

## 2016-02-22 MED ORDER — NITROGLYCERIN IN D5W 200-5 MCG/ML-% IV SOLN
0.0000 ug/min | Freq: Once | INTRAVENOUS | Status: AC
Start: 2016-02-22 — End: 2016-02-22
  Administered 2016-02-22: 35 ug/min via INTRAVENOUS

## 2016-02-22 MED ORDER — NITROGLYCERIN IN D5W 200-5 MCG/ML-% IV SOLN
0.0000 ug/min | Freq: Once | INTRAVENOUS | Status: AC
Start: 2016-02-22 — End: 2016-02-22
  Administered 2016-02-22: 10 ug/min via INTRAVENOUS
  Filled 2016-02-22: qty 250

## 2016-02-22 MED ORDER — TOPIRAMATE 100 MG PO TABS
100.0000 mg | ORAL_TABLET | Freq: Two times a day (BID) | ORAL | Status: DC
  Administered 2016-02-22 – 2016-02-23 (×2): 100 mg via ORAL
  Filled 2016-02-22 (×3): qty 1

## 2016-02-22 MED ORDER — NIFEDIPINE ER OSMOTIC RELEASE 30 MG PO TB24
30.0000 mg | ORAL_TABLET | Freq: Every day | ORAL | Status: DC
  Administered 2016-02-23: 30 mg via ORAL
  Filled 2016-02-22: qty 1

## 2016-02-22 MED ORDER — GABAPENTIN 100 MG PO CAPS
200.0000 mg | ORAL_CAPSULE | Freq: Every day | ORAL | Status: DC
  Administered 2016-02-22: 200 mg via ORAL
  Filled 2016-02-22: qty 2

## 2016-02-22 MED ORDER — MORPHINE SULFATE (PF) 2 MG/ML IV SOLN
2.0000 mg | INTRAVENOUS | Status: DC | PRN
  Administered 2016-02-22 – 2016-02-23 (×3): 2 mg via INTRAVENOUS
  Filled 2016-02-22 (×3): qty 1

## 2016-02-22 MED ORDER — LORATADINE 10 MG PO TABS
10.0000 mg | ORAL_TABLET | Freq: Every day | ORAL | Status: DC
  Administered 2016-02-23: 10 mg via ORAL
  Filled 2016-02-22: qty 1

## 2016-02-22 MED ORDER — PRAVASTATIN SODIUM 20 MG PO TABS
20.0000 mg | ORAL_TABLET | Freq: Every day | ORAL | Status: DC
  Administered 2016-02-22: 20 mg via ORAL
  Filled 2016-02-22: qty 1

## 2016-02-22 MED ORDER — ONDANSETRON HCL 4 MG/2ML IJ SOLN
4.0000 mg | Freq: Four times a day (QID) | INTRAMUSCULAR | Status: DC | PRN
  Administered 2016-02-22: 4 mg via INTRAVENOUS
  Filled 2016-02-22: qty 2

## 2016-02-22 MED ORDER — SODIUM CHLORIDE 0.9 % IV SOLN
Freq: Once | INTRAVENOUS | Status: AC
  Administered 2016-02-22: 11:00:00 via INTRAVENOUS

## 2016-02-22 MED ORDER — HEPARIN BOLUS VIA INFUSION
4000.0000 [IU] | Freq: Once | INTRAVENOUS | Status: AC
  Administered 2016-02-22: 4000 [IU] via INTRAVENOUS
  Filled 2016-02-22: qty 4000

## 2016-02-22 MED ORDER — ASPIRIN 81 MG PO CHEW
324.0000 mg | CHEWABLE_TABLET | Freq: Once | ORAL | Status: AC
  Administered 2016-02-22: 324 mg via ORAL
  Filled 2016-02-22: qty 4

## 2016-02-22 MED ORDER — ISOSORBIDE MONONITRATE ER 60 MG PO TB24
60.0000 mg | ORAL_TABLET | Freq: Every day | ORAL | Status: DC
  Administered 2016-02-23: 60 mg via ORAL
  Filled 2016-02-22: qty 1

## 2016-02-22 MED ORDER — LOSARTAN POTASSIUM 50 MG PO TABS
50.0000 mg | ORAL_TABLET | Freq: Every day | ORAL | Status: DC
  Administered 2016-02-23: 50 mg via ORAL
  Filled 2016-02-22: qty 1

## 2016-02-22 MED ORDER — HEPARIN (PORCINE) IN NACL 100-0.45 UNIT/ML-% IJ SOLN
1250.0000 [IU]/h | INTRAMUSCULAR | Status: DC
  Administered 2016-02-22: 1000 [IU]/h via INTRAVENOUS
  Administered 2016-02-23: 1150 [IU]/h via INTRAVENOUS
  Filled 2016-02-22: qty 250

## 2016-02-22 MED ORDER — NITROGLYCERIN 2 % TD OINT
1.0000 [in_us] | TOPICAL_OINTMENT | Freq: Once | TRANSDERMAL | Status: AC
  Administered 2016-02-22: 1 [in_us] via TOPICAL
  Filled 2016-02-22: qty 1

## 2016-02-22 MED ORDER — ADULT MULTIVITAMIN W/MINERALS CH
1.0000 | ORAL_TABLET | Freq: Every day | ORAL | Status: DC
Start: 2016-02-23 — End: 2016-02-23
  Administered 2016-02-23: 1 via ORAL
  Filled 2016-02-22: qty 1

## 2016-02-22 MED ORDER — CLOPIDOGREL BISULFATE 75 MG PO TABS
75.0000 mg | ORAL_TABLET | Freq: Every day | ORAL | Status: DC
  Administered 2016-02-23: 75 mg via ORAL
  Filled 2016-02-22: qty 1

## 2016-02-22 MED ORDER — NITROGLYCERIN 0.4 MG SL SUBL
0.4000 mg | SUBLINGUAL_TABLET | SUBLINGUAL | Status: DC | PRN
  Administered 2016-02-22 (×3): 0.4 mg via SUBLINGUAL
  Filled 2016-02-22: qty 1

## 2016-02-22 MED ORDER — INFLUENZA VAC SPLIT QUAD 0.5 ML IM SUSY
0.5000 mL | PREFILLED_SYRINGE | INTRAMUSCULAR | Status: AC
  Administered 2016-02-23: 0.5 mL via INTRAMUSCULAR
  Filled 2016-02-22: qty 0.5

## 2016-02-22 MED ORDER — CARVEDILOL 12.5 MG PO TABS
12.5000 mg | ORAL_TABLET | Freq: Two times a day (BID) | ORAL | Status: DC
  Administered 2016-02-22 – 2016-02-23 (×2): 12.5 mg via ORAL
  Filled 2016-02-22 (×2): qty 1

## 2016-02-22 MED ORDER — ASPIRIN 81 MG PO CHEW
81.0000 mg | CHEWABLE_TABLET | Freq: Every day | ORAL | Status: DC
  Administered 2016-02-23: 81 mg via ORAL
  Filled 2016-02-22: qty 1

## 2016-02-22 MED ORDER — ACETAMINOPHEN 325 MG PO TABS
650.0000 mg | ORAL_TABLET | ORAL | Status: DC | PRN
  Administered 2016-02-22 (×2): 650 mg via ORAL
  Filled 2016-02-22 (×2): qty 2

## 2016-02-22 NOTE — ED Notes (Signed)
Nitro paste removed from L chest per Tresa Endo, MD request, pt started on IV nitro

## 2016-02-22 NOTE — Progress Notes (Signed)
ANTICOAGULATION CONSULT NOTE - Initial Consult  Pharmacy Consult for heparin  Indication: chest pain/ACS  No Known Allergies  Patient Measurements: Height:  (167.6 cm) Weight: 155 lb (70.308 kg) IBW/kg (Calculated) : 63.8 Heparin Dosing Weight: 70.3 kg  Vital Signs: Temp: 98.2 F (36.8 C) (02/28 0649) Temp Source: Oral (02/28 0649) BP: 129/97 mmHg (02/28 0900) Pulse Rate: 79 (02/28 0900)  Labs:  Recent Labs  02/22/16 0739 02/22/16 0740  HGB 14.2  --   HCT 43.8  --   PLT 251  --   CREATININE 0.87  --   TROPONINI  --  <0.03    Estimated Creatinine Clearance: 90.6 mL/min (by C-G formula based on Cr of 0.87).   Medical History: Past Medical History  Diagnosis Date  . Hypertension   . Dyslipidemia   . Myocardial infarction (HCC) 2010  . Angina   . Migraine 03/05/12    "still have them every now and then"  . CAD (coronary artery disease)   . OSA on CPAP   . Family history of heart disease   . ED (erectile dysfunction)   . History of nuclear stress test 04/18/2011    exercise myoview; normal pattern of perfusion in all regions; low risk scan     Medications:   (Not in a hospital admission)  Assessment: 50 yo male with known cardiac disease. Admitted with chest, shoulder pain, numb jaw, nausea and SOB. Has history of CAD s/p PCI in 2010. CBC is stable.   Goal of Therapy:  Heparin level 0.3-0.7 units/ml Monitor platelets by anticoagulation protocol: Yes   Plan:  -Heparin bolus 4000 units x1 then 1000 units/hr -Daily HL, CBC -First HL this afternoon -F/u cards recommendations   Baldemar Friday 02/22/2016,9:41 AM

## 2016-02-22 NOTE — ED Notes (Signed)
ECHO being completed at the bedside at this time

## 2016-02-22 NOTE — ED Provider Notes (Signed)
CSN: [id number]     Arrival date & time 02/22/16  0527 History   First MD Initiated Contact with Patient 02/22/16 [id number]     Chief Complaint  Patient presents with  . Chest Pain    \     (Consider location/radiation/quality/duration/timing/severity/associated sxs/prior Treatment) Patient is a 50 y.o. male presenting with chest pain. The history is provided by the patient.  Chest Pain Pain location:  Substernal area Pain quality: pressure and tightness   Pain radiates to:  L shoulder and L jaw Pain radiates to the back: no   Pain severity:  Moderate Onset quality:  Sudden Duration:  3 hours Timing:  Constant Progression:  Unchanged Chronicity:  New Context: at rest   Relieved by:  Nothing Worsened by:  Nothing tried Ineffective treatments:  None tried Associated symptoms: diaphoresis, nausea and shortness of breath   Associated symptoms: no fever and no lower extremity edema   Risk factors: coronary artery disease, high cholesterol, hypertension and male sex   Risk factors: no diabetes mellitus and no smoking     Past Medical History  Diagnosis Date  . Hypertension   . Dyslipidemia   . Myocardial infarction (HCC) 2010  . Angina   . Migraine 03/05/12    "still have them every now and then"  . CAD (coronary artery disease)   . OSA on CPAP   . Family history of heart disease   . ED (erectile dysfunction)   . History of nuclear stress test 04/18/2011    exercise myoview; normal pattern of perfusion in all regions; low risk scan    Past Surgical History  Procedure Laterality Date  . Rt arm surgery  1960's    "caught in washing machine; have had 2 skin grafts there"  . Coronary angioplasty with stent placement  07/09/2009    PCI & stenting with Xience DES of circumflex & RCA  . Cardiac catheterization  03/05/12    patent stents, diffusely disease ramus branch  . Tonsillectomy and adenoidectomy      "when I was a kid"  . Cholecystectomy  ?1990's  . Laceration repair  ~  2009    right thumb  . Left heart catheterization with coronary angiogram N/A 03/05/2012    Procedure: LEFT HEART CATHETERIZATION WITH CORONARY ANGIOGRAM;  Surgeon: Runell Gess, MD;  Location: [facility] CATH LAB;  Service: Cardiovascular;  Laterality: N/A;   Family History  Problem Relation Age of Onset  . Coronary artery disease Father     MI at 11, CABG  . Heart attack Father 58    now has ICD  . Coronary artery disease Brother     MI in 30s  . Heart attack Brother 30  . Heart attack Maternal Grandfather     in 53s  . Heart disease Other     CABG in 40s  . Stroke Maternal Grandmother   . Heart disease Paternal Grandmother     CABGs in 62s  . Cancer Mother   . Healthy Sister   . Healthy Sister    Social History  Substance Use Topics  . Smoking status: Never Smoker   . Smokeless tobacco: Never Used  . Alcohol Use: No     Comment: "quit drinking ~ 2010"    Review of Systems  Constitutional: Positive for diaphoresis. Negative for fever.  Respiratory: Positive for shortness of breath.   Cardiovascular: Positive for chest pain.  Gastrointestinal: Positive for nausea.  All other systems reviewed and are negative.  Allergies  Review of patient's allergies indicates no known allergies.  Home Medications   Prior to Admission medications   Medication Sig Start Date End Date Taking? Authorizing Provider  aspirin 81 MG chewable tablet Chew 81 mg by mouth daily.   Yes Historical Provider, MD  carvedilol (COREG) 12.5 MG tablet TAKE 1 TABLET BY MOUTH 2 TIMES DAILY WITH A MEAL. 03/17/15  Yes Runell Gess, MD  clopidogrel (PLAVIX) 75 MG tablet Take 1 tablet (75 mg total) by mouth daily. 03/17/15  Yes Runell Gess, MD  famotidine (PEPCID) 40 MG tablet Take 1 tablet (40 mg total) by mouth daily. NEED APPT FOR FUTURE REFILLS 12/31/14  Yes Runell Gess, MD  fexofenadine (ALLEGRA) 180 MG tablet Take 180 mg by mouth daily.   Yes Historical Provider, MD  gabapentin (NEURONTIN)  100 MG capsule Take 200 mg by mouth at bedtime. 01/10/16  Yes Historical Provider, MD  isosorbide mononitrate (IMDUR) 60 MG 24 hr tablet Take 1 tablet (60 mg total) by mouth daily. 03/17/15  Yes Runell Gess, MD  losartan (COZAAR) 50 MG tablet Take 1 tablet (50 mg total) by mouth daily. 03/17/15  Yes Runell Gess, MD  Multiple Vitamin (MULTIVITAMIN) tablet Take 1 tablet by mouth daily.   Yes Historical Provider, MD  NIFEdipine (PROCARDIA-XL/ADALAT-CC/NIFEDICAL-XL) 30 MG 24 hr tablet Take 1 tablet (30 mg total) by mouth daily. 01/12/16  Yes Runell Gess, MD  nitroGLYCERIN (NITROSTAT) 0.4 MG SL tablet Place 0.4 mg under the tongue every 5 (five) minutes as needed for chest pain.   Yes Historical Provider, MD  pravastatin (PRAVACHOL) 20 MG tablet TAKE 1 TABLET (20 MG TOTAL) BY MOUTH DAILY. 03/17/15  Yes Runell Gess, MD  topiramate (TOPAMAX) 100 MG tablet Take 100 mg by mouth 2 (two) times daily.   Yes Historical Provider, MD   BP 133/101 mmHg  Pulse 73  Temp(Src) 98.2 F (36.8 C) (Oral)  Resp 18  Ht 5\' 6"  (1.676 m)  Wt 155 lb (70.308 kg)  BMI 25.03 kg/m2  SpO2 100% Physical Exam  Constitutional: He is oriented to person, place, and time. He appears well-developed and well-nourished. No distress.  HENT:  Head: Normocephalic and atraumatic.  Eyes: Conjunctivae are normal.  Neck: Neck supple. No tracheal deviation present.  Cardiovascular: Normal rate, regular rhythm and normal heart sounds.   Pulmonary/Chest: Effort normal and breath sounds normal. No respiratory distress. He has no wheezes. He has no rales.  Abdominal: Soft. He exhibits no distension. There is no tenderness.  Neurological: He is alert and oriented to person, place, and time.  Skin: Skin is warm and dry.  Psychiatric: He has a normal mood and affect.  Vitals reviewed.   ED Course  Procedures (including critical care time) Labs Review Labs Reviewed  BASIC METABOLIC PANEL - Abnormal; Notable for the  following:    Glucose, Bld 120 (*)    All other components within normal limits  CBC  TROPONIN I  I-STAT TROPOININ, ED    Imaging Review Dg Chest 2 View  02/22/2016  CLINICAL DATA:  Acute onset of generalized chest pain and shortness of breath. Initial encounter. EXAM: CHEST  2 VIEW COMPARISON:  Chest radiograph from 05/04/2015 FINDINGS: The lungs are well-aerated and clear. There is no evidence of focal opacification, pleural effusion or pneumothorax. The heart is normal in size; the mediastinal contour is within normal limits. No acute osseous abnormalities are seen. Clips are noted within the right upper quadrant, reflecting  prior cholecystectomy. IMPRESSION: No acute cardiopulmonary process seen. Electronically Signed   By: Roanna Raider M.D.   On: 02/22/2016 06:12   I have personally reviewed and evaluated these images and lab results as part of my medical decision-making.   EKG Interpretation   Date/Time:  Tuesday February 22 2016 05:38:51 EST Ventricular Rate:  80 PR Interval:  142 QRS Duration: 84 QT Interval:  342 QTC Calculation: 394 R Axis:   74 Text Interpretation:  Normal sinus rhythm Normal ECG No significant change  since last tracing Confirmed by Harlin Mazzoni MD, Aldan Camey [phone]) on 02/22/2016  6:57:39 AM      EKG Interpretation  Date/Time:  Tuesday February 22 2016 09:17:44 EST Ventricular Rate:  62 PR Interval:  133 QRS Duration: 96 QT Interval:  382 QTC Calculation: 388 R Axis:   55 Text Interpretation:  Sinus rhythm ST elev, probable normal early repol pattern No significant change since last tracing Confirmed by Antonina Deziel MD, Reuel Boom ([id number]) on 02/22/2016 9:50:43 AM        MDM   Final diagnoses:  Unstable angina [facility])   Primary cardiologist: Allyson Sabal  50 y.o. male presents with chest pain starting this morning after he was on his way to work 2 hours prior to arrival. He has history of CAD and is seen by cardiology here. He states pain radiates to his left shoulder  and he feels numbness in his left jaw which is typical of his previous MI. EKG without significant changes from previous tracing, first troponin is negative but early in course of chest pain. Last catheterization was 3 years ago which was negative for any acute disease with patent stents. He took sublingual nitroglycerin with some improvement but pain did not go away.  Persistent pain and typical symptoms is concerning for unstable angina given patient's high risk demographic. Cardiology was consulted for admission and will see the patient in the emergency department.    Lyndal Pulley, MD 02/22/16 [phone]

## 2016-02-22 NOTE — Progress Notes (Signed)
*  PRELIMINARY RESULTS* Echocardiogram 2D Echocardiogram has been performed.  Jeryl Columbia 02/22/2016, 11:54 AM

## 2016-02-22 NOTE — Progress Notes (Signed)
ANTICOAGULATION CONSULT NOTE  Pharmacy Consult for heparin  Indication: chest pain/ACS  No Known Allergies  Patient Measurements: Height:  (167.6 cm) Weight: 153 lb 11.2 oz (69.718 kg) IBW/kg (Calculated) : 63.8   Vital Signs: Temp: 98.4 F (36.9 C) (02/28 1446) Temp Source: Oral (02/28 1446) BP: 143/91 mmHg (02/28 1730) Pulse Rate: 73 (02/28 1730)  Labs:  Recent Labs  02/22/16 0739 02/22/16 0740 02/22/16 1055 02/22/16 1514  HGB 14.2  --   --   --   HCT 43.8  --   --   --   PLT 251  --   --   --   HEPARINUNFRC  --   --   --  0.27*  CREATININE 0.87  --   --   --   TROPONINI  --  <0.03 <0.03 <0.03    Estimated Creatinine Clearance: 90.6 mL/min (by C-G formula based on Cr of 0.87).  Assessment: 50 yo male with known cardiac disease. Admitted with chest, shoulder pain, numb jaw, nausea and SOB. Has history of CAD s/p PCI in 2010. CBC is stable.  First HL after 4000 unit bolus and drip at 1000 units/hr is slightly low at 0.27.   Goal of Therapy:  Heparin level 0.3-0.7 units/ml Monitor platelets by anticoagulation protocol: Yes   Plan:  Increase heparin drip to 1150 units/hr and recheck HL with am labs Daily HL/CBC while on heparin  Herby Abraham, Pharm.D. [phone] 02/22/2016 6:05 PM

## 2016-02-22 NOTE — ED Notes (Signed)
Meng, [place] showed repeat EKG, verbal order placed for Heparin per pharmacy

## 2016-02-22 NOTE — ED Notes (Signed)
Spoke with lab re: labs not resulting, per lab a male nurse was notified re: needing a CBC sent, this Rn informed lab that the CBC lab would be collected & sent down immediately for analysis

## 2016-02-22 NOTE — ED Notes (Signed)
Heart healthy diet ordered. 

## 2016-02-22 NOTE — H&P (Signed)
Patient ID: MALAQUIAS LENKER MRN: [id number], DOB/AGE: 02-Jun-1966   Admit date: 02/22/2016   Primary Physician: Ann Held, MD Primary Cardiologist: Dr. Gwenlyn Found  Pt. Profile:  Mr. Okray is a 50 year old Caucasian male with past medical history of HTN, HLD, OSA on CPAP and CAD s/p stent to LCx and RCA in 2010 with 70% ramus residual presented with onset of L sided substernal chest pain since 4 AM in the morning of 02/14/2016.  Problem List  Past Medical History  Diagnosis Date  . Hypertension   . Dyslipidemia   . Myocardial infarction ([place]) 2010  . Angina   . Migraine 03/05/12    "still have them every now and then"  . CAD (coronary artery disease)   . OSA on CPAP   . Family history of heart disease   . ED (erectile dysfunction)   . History of nuclear stress test 04/18/2011    exercise myoview; normal pattern of perfusion in all regions; low risk scan     Past Surgical History  Procedure Laterality Date  . Rt arm surgery  1960's    "caught in washing machine; have had 2 skin grafts there"  . Coronary angioplasty with stent placement  07/09/2009    PCI & stenting with Xience DES of circumflex & RCA  . Cardiac catheterization  03/05/12    patent stents, diffusely disease ramus branch  . Tonsillectomy and adenoidectomy      "when I was a kid"  . Cholecystectomy  ?1990's  . Laceration repair  ~ 2009    right thumb  . Left heart catheterization with coronary angiogram N/A 03/05/2012    Procedure: LEFT HEART CATHETERIZATION WITH CORONARY ANGIOGRAM;  Surgeon: Lorretta Harp, MD;  Location: [facility] CATH LAB;  Service: Cardiovascular;  Laterality: N/A;     Allergies  No Known Allergies  HPI  Mr. Villard is a 50 year old Caucasian male with past medical history of HTN, HLD, OSA on CPAP and CAD s/p stent to LCx and RCA in 2010. Since then, he has had repeat cardiac catheterization in 2011, 2012, 2013 and lastly January 2015 at [facility]. He has a known 70% residual in ramus  which was treated medically. He has otherwise been compliant with aspirin and Plavix. He does have occasional chest discomfort recently lasting 5 minutes each occurring at rest. He denies any recent exertion. He denies any recent fever, chill, cough, lower extremity edema, orthopnea or paroxysmal nocturnal dyspnea.  He got up around 2:30 AM in the morning of 02/22/2016 going to work. 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 On his way back around 4 AM this morning, he started having left-sided chest discomfort radiating to the neck jaw and the left shoulder. He also has diaphoresis as well. The chest discomfort is similar to his previous symptom prior to the regional MI in 2010. This has prompted the patient to seek medical attention at [facility]. Initial EKG was interpreted as normal, however does show an elevated J-point in the inferior lead and also V6. Repeat EKG continued to show the same without significant progression. Troponin was normal. CBC and be met was normal on arrival as well. Chest x-ray showed no significant acute intrapulmonary etiology. Cardiology has been consulted for chest pain.  At the time of interview, patient has been persistently having chest pain for the past 5 hours without relief. Initial troponin was obtained 3 hours and 40 minutes after the onset of chest discomfort  which was negative. He states he did take 3 sublingual nitroglycerin at home which improved the pain however has never completely taking anyway.   Home Medications  Prior to Admission medications   Medication Sig Start Date End Date Taking? Authorizing Provider  aspirin 81 MG chewable tablet Chew 81 mg by mouth daily.   Yes Historical Provider, MD  carvedilol (COREG) 12.5 MG tablet TAKE 1 TABLET BY MOUTH 2 TIMES DAILY WITH A MEAL. 03/17/15  Yes Lorretta Harp, MD  clopidogrel (PLAVIX) 75 MG tablet Take 1 tablet (75 mg total) by mouth daily. 03/17/15  Yes Lorretta Harp, MD    famotidine (PEPCID) 40 MG tablet Take 1 tablet (40 mg total) by mouth daily. NEED APPT FOR FUTURE REFILLS 12/31/14  Yes Lorretta Harp, MD  fexofenadine (ALLEGRA) 180 MG tablet Take 180 mg by mouth daily.   Yes Historical Provider, MD  gabapentin (NEURONTIN) 100 MG capsule Take 200 mg by mouth at bedtime. 01/10/16  Yes Historical Provider, MD  isosorbide mononitrate (IMDUR) 60 MG 24 hr tablet Take 1 tablet (60 mg total) by mouth daily. 03/17/15  Yes Lorretta Harp, MD  losartan (COZAAR) 50 MG tablet Take 1 tablet (50 mg total) by mouth daily. 03/17/15  Yes Lorretta Harp, MD  Multiple Vitamin (MULTIVITAMIN) tablet Take 1 tablet by mouth daily.   Yes Historical Provider, MD  NIFEdipine (PROCARDIA-XL/ADALAT-CC/NIFEDICAL-XL) 30 MG 24 hr tablet Take 1 tablet (30 mg total) by mouth daily. 01/12/16  Yes Lorretta Harp, MD  nitroGLYCERIN (NITROSTAT) 0.4 MG SL tablet Place 0.4 mg under the tongue every 5 (five) minutes as needed for chest pain.   Yes Historical Provider, MD  pravastatin (PRAVACHOL) 20 MG tablet TAKE 1 TABLET (20 MG TOTAL) BY MOUTH DAILY. 03/17/15  Yes Lorretta Harp, MD  topiramate (TOPAMAX) 100 MG tablet Take 100 mg by mouth 2 (two) times daily.   Yes Historical Provider, MD    Family History  Family History  Problem Relation Age of Onset  . Coronary artery disease Father     MI at 66, CABG  . Heart attack Father 57    now has ICD  . Coronary artery disease Brother     MI in 93s  . Heart attack Brother 61  . Heart attack Maternal Grandfather     in 51s  . Heart disease Other     CABG in 62s  . Stroke Maternal Grandmother   . Heart disease Paternal Grandmother     CABGs in 20s  . Cancer Mother   . Healthy Sister   . Healthy Sister     Social History  Social History   Social History  . Marital Status: Married    Spouse Name: N/A  . Number of Children: 1  . Years of Education: N/A   Occupational History  . Truck driver     drives for [employer]  .     Social  History Main Topics  . Smoking status: Never Smoker   . Smokeless tobacco: Never Used  . Alcohol Use: No     Comment: "quit drinking ~ 2010"  . Drug Use: No  . Sexual Activity: Not Currently   Other Topics Concern  . Not on file   Social History Narrative      Pt lives at home with his spouse.   Caffeine Use- Drinks soda daily     Review of Systems General:  No chills, fever, night sweats or weight changes. +  Diaphoresis Cardiovascular:  No dyspnea on exertion, edema, orthopnea, palpitations, paroxysmal nocturnal dyspnea. +chest pain Dermatological: No rash, lesions/masses Respiratory: No cough, dyspnea Urologic: No hematuria, dysuria Abdominal:   No nausea, vomiting, diarrhea, bright red blood per rectum, melena, or hematemesis Neurologic:  No visual changes, wkns, changes in mental status. All other systems reviewed and are otherwise negative except as noted above.  Physical Exam  Blood pressure 124/90, pulse 62, temperature 98.2 F (36.8 C), temperature source Oral, resp. rate 18, height _0  (1.676 m), weight 155 lb (70.308 kg), SpO2 98 %.  General: Pleasant, NAD Psych: Normal affect. Neuro: Alert and oriented X 3. Moves all extremities spontaneously. HEENT: Normal  Neck: Supple without bruits or JVD. Lungs:  Resp regular and unlabored, CTA. Heart: RRR no s3, s4, or murmurs. Abdomen: Soft, non-tender, non-distended, BS + x 4.  Extremities: No clubbing, cyanosis or edema. DP/PT/Radials 2+ and equal bilaterally.  Labs  Troponin [facility] of Care Test)  Recent Labs  02/22/16 4825  TROPIPOC 0.00    Recent Labs  02/22/16 0740  TROPONINI <0.03   Lab Results  Component Value Date   WBC 7.5 02/22/2016   HGB 14.2 02/22/2016   HCT 43.8 02/22/2016   MCV 85.5 02/22/2016   PLT 251 02/22/2016    Recent Labs Lab 02/22/16 0739  NA 140  K 5.1  CL 107  CO2 25  BUN 19  CREATININE 0.87  CALCIUM 9.3  GLUCOSE 120*   Lab Results  Component Value Date   CHOL   03/29/2011    136        ATP III CLASSIFICATION:  <200     mg/dL   Desirable  200-239  mg/dL   Borderline High  >=240    mg/dL   High          HDL 49 03/29/2011   LDLCALC  03/29/2011    73        Total Cholesterol/HDL:CHD Risk Coronary Heart Disease Risk Table                     Men   Women  1/2 Average Risk   3.4   3.3  Average Risk       5.0   4.4  2 X Average Risk   9.6   7.1  3 X Average Risk  23.4   11.0        Use the calculated Patient Ratio above and the CHD Risk Table to determine the patient's CHD Risk.        ATP III CLASSIFICATION (LDL):  <100     mg/dL   Optimal  100-129  mg/dL   Near or Above                    Optimal  130-159  mg/dL   Borderline  160-189  mg/dL   High  >190     mg/dL   Very High   TRIG 68 03/29/2011   No results found for: DDIMER   Radiology/Studies  Dg Chest 2 View  02/22/2016  CLINICAL DATA:  Acute onset of generalized chest pain and shortness of breath. Initial encounter. EXAM: CHEST  2 VIEW COMPARISON:  Chest radiograph from 05/04/2015 FINDINGS: The lungs are well-aerated and clear. There is no evidence of focal opacification, pleural effusion or pneumothorax. The heart is normal in size; the mediastinal contour is within normal limits. No acute osseous abnormalities are seen. Clips are noted within the right  upper quadrant, reflecting prior cholecystectomy. IMPRESSION: No acute cardiopulmonary process seen. Electronically Signed   By: Garald Balding M.D.   On: 02/22/2016 06:12    ECG  Normal sinus rhythm with J-point elevation in the inferior lead.  Echocardiogram  Pending repeat    ASSESSMENT AND PLAN  1. Chest pain with elevated J-point in the inferior lead and also V6  - Will discuss with M.D. regarding whether this is considered inferior STEMI or early repol. EKG has been compared to the previous EKG in March 2016 which did not show J-point elevation.  - Obtain repeat stat echocardiogram  Addendum: per Dr. Claiborne Billings, likely  early repol, will obtain trop and echo during mean time. If trop does come back positive, will cath, otherwise will do IV nitro and IV heparin during mean time.  2. CAD s/p stent to LCx and RCA in 2010  - repeat cath in 2011, 2012, 2013 and Jan 2015 without reocclusion of the stent.  3. HTN 4. HLD 5. OSA on CPAP    Signed, Almyra Deforest, [place] 02/22/2016, 9:18 AM    Patient seen and examined. Agree with assessment and plan.  Mr.  Zeitz is a 50 year old white male who has a history of hypertension, hyperlipidemia, active sleep apnea on CPAP therapy, and established coronary artery disease dating back to 2010.  In 2010, the patient underwent stenting to the left circumflex and the posterior lateral branch of the RCA.  He has been found to have a long segmental 70% stenosis in a small ramus intermediate/high marginal branch vessel and this has been treated medically.  The patient has undergone at least 4 subsequent cardiac catheterizations since 2010,  which have not demonstrated significant progressive disease.  The patient admits to occasional episodes of chest discomfort typically nonexertional while sitting down.  He developed chest pain earlier this morning was left-sided with jaw radiation.  He felt there was some partial response to sublingual nitroglycerin.  Several emergency room ECGs have shown very minimal J-point elevation in leads II, III, and F, which have not progressed and suggest possible early repolarization changes.  Initial troponins have been negative.  The pain pattern has not progressed.  A chest x-ray did not reveal significant abnormality.  His exam was notable for blood pressure 124/90.  He was not tachycardic.  He had a normal O2 saturation 98%.  HEENT was unremarkable.  He did not have carotid bruits.  His lungs were clear.  He did not have definitive chest wall tenderness.  There was no audible rub and his rhythm was regular.  Abdomen was nontender without hepatosplenomegaly or  hepatojugular reflux.  Bowel sounds are present.  Distal pulses were normal and there was no edema.  He states his pain today is somewhat similar to the rest pain which he had an experience intermittently recently.  He denies any clear-cut exertional precipitation to this discomfort.  There was some question as whether or not this was similar to the initial chest pain of 2010.  At present, I do not feel urgent cardiac catheterization is necessary.  I will initially treat medically with serial troponin levels, ECGs, and will obtain an echo Doppler study to assess for systolic and diastolic function, wall motion, as well as pericardial etiology.  He will be started on IV nitrate therapy.  Further recommendations will be forthcoming upon clinical status and results of the above testing including nuclear imaging versus repeat cardiac catheterization.   Troy Sine, MD, [facility] 02/22/2016 11:55 AM

## 2016-02-22 NOTE — ED Notes (Signed)
Patient here with complaint of chest pain which started at 0400. Patient took 3x SL NTG 0.4mg  without improvement of pain. States pain is similar to pain he experienced prior to having 2 stents in 2010.

## 2016-02-22 NOTE — ED Notes (Signed)
Urinal given; visitor at bedside

## 2016-02-22 NOTE — ED Notes (Signed)
Pt here for chest pain, jaw numb and left shoulder pain, nausea, and sob. Onset at 4 am when sleeping, took 3 ntg and asa with no relief. Hx of 2 stents in 2010 and told that 1 artery in back of heart couldn't be reached to stent.

## 2016-02-23 ENCOUNTER — Observation Stay (HOSPITAL_COMMUNITY): Payer: 59

## 2016-02-23 DIAGNOSIS — E785 Hyperlipidemia, unspecified: Secondary | ICD-10-CM | POA: Diagnosis not present

## 2016-02-23 DIAGNOSIS — R079 Chest pain, unspecified: Secondary | ICD-10-CM | POA: Diagnosis not present

## 2016-02-23 DIAGNOSIS — I2510 Atherosclerotic heart disease of native coronary artery without angina pectoris: Secondary | ICD-10-CM | POA: Diagnosis not present

## 2016-02-23 DIAGNOSIS — I252 Old myocardial infarction: Secondary | ICD-10-CM | POA: Diagnosis not present

## 2016-02-23 DIAGNOSIS — I25110 Atherosclerotic heart disease of native coronary artery with unstable angina pectoris: Secondary | ICD-10-CM | POA: Diagnosis not present

## 2016-02-23 DIAGNOSIS — I10 Essential (primary) hypertension: Secondary | ICD-10-CM | POA: Diagnosis not present

## 2016-02-23 LAB — CBC
HEMATOCRIT: 40.4 % (ref 39.0–52.0)
Hemoglobin: 13.5 g/dL (ref 13.0–17.0)
MCH: 28 pg (ref 26.0–34.0)
MCHC: 33.4 g/dL (ref 30.0–36.0)
MCV: 83.8 fL (ref 78.0–100.0)
Platelets: 193 10*3/uL (ref 150–400)
RBC: 4.82 MIL/uL (ref 4.22–5.81)
RDW: 14.3 % (ref 11.5–15.5)
WBC: 9.7 10*3/uL (ref 4.0–10.5)

## 2016-02-23 LAB — NM MYOCAR MULTI W/SPECT W/WALL MOTION / EF
CHL CUP RESTING HR STRESS: 62 {beats}/min
CSEPED: 0 min
CSEPEDS: 0 s
CSEPEW: 1 METS
MPHR: 169 {beats}/min
Peak HR: 112 {beats}/min
Percent HR: 66 %

## 2016-02-23 LAB — LIPID PANEL
Cholesterol: 135 mg/dL (ref 0–200)
HDL: 40 mg/dL — ABNORMAL LOW (ref >40)
LDL CALC: 79 mg/dL (ref 0–99)
TRIGLYCERIDES: 82 mg/dL (ref <150)
Total CHOL/HDL Ratio: 3.4 RATIO
VLDL: 16 mg/dL (ref 0–40)

## 2016-02-23 LAB — BASIC METABOLIC PANEL
Anion gap: 10 (ref 5–15)
BUN: 18 mg/dL (ref 6–20)
CALCIUM: 8.5 mg/dL — AB (ref 8.9–10.3)
CO2: 22 mmol/L (ref 22–32)
CREATININE: 0.7 mg/dL (ref 0.61–1.24)
Chloride: 106 mmol/L (ref 101–111)
GFR calc non Af Amer: >60 mL/min (ref >60)
Glucose, Bld: 115 mg/dL — ABNORMAL HIGH (ref 65–99)
Potassium: 3.7 mmol/L (ref 3.5–5.1)
SODIUM: 138 mmol/L (ref 135–145)

## 2016-02-23 LAB — HEPARIN LEVEL (UNFRACTIONATED)
HEPARIN UNFRACTIONATED: 0.31 [IU]/mL (ref 0.30–0.70)
Heparin Unfractionated: 0.4 IU/mL (ref 0.30–0.70)

## 2016-02-23 LAB — TROPONIN I

## 2016-02-23 MED ORDER — REGADENOSON 0.4 MG/5ML IV SOLN
0.4000 mg | Freq: Once | INTRAVENOUS | Status: AC
  Administered 2016-02-23: 0.4 mg via INTRAVENOUS

## 2016-02-23 MED ORDER — TECHNETIUM TC 99M SESTAMIBI GENERIC - CARDIOLITE
10.0000 | Freq: Once | INTRAVENOUS | Status: AC | PRN
  Administered 2016-02-23: 10 via INTRAVENOUS

## 2016-02-23 MED ORDER — TECHNETIUM TC 99M SESTAMIBI GENERIC - CARDIOLITE
30.0000 | Freq: Once | INTRAVENOUS | Status: AC | PRN
  Administered 2016-02-23: 30 via INTRAVENOUS

## 2016-02-23 MED ORDER — REGADENOSON 0.4 MG/5ML IV SOLN
INTRAVENOUS | Status: AC
  Filled 2016-02-23: qty 5

## 2016-02-23 NOTE — Plan of Care (Signed)
Problem: Education: Goal: Knowledge of [facility] Education information/materials will improve Outcome: Progressing Patient has been in this hospital before for the same reason and is able to verbalize what he will be doing in the AM for his stress test and is aware not to eat or drink after midnight. Patient has a call light and uses it appropriately and bed alarm was on but patient sat up at the side of his bed to void and the alarm went off and scared him so I told him to make sure he calls me after he voids and if he needs me and showed him the white board with the RN/NT phone numbers on it and patient has been compliant, will continue to monitor.

## 2016-02-23 NOTE — Progress Notes (Signed)
ANTICOAGULATION CONSULT NOTE  Pharmacy Consult for heparin  Indication: chest pain/ACS  No Known Allergies  Patient Measurements: Height:  (167.6 cm) Weight: 151 lb 14.4 oz (68.901 kg) IBW/kg (Calculated) : 63.8   Vital Signs: Temp: 98.2 F (36.8 C) (03/01 0400) Temp Source: Oral (03/01 0400) BP: 139/82 mmHg (03/01 0911) Pulse Rate: 90 (03/01 0911)  Labs:  Recent Labs  02/22/16 0739  02/22/16 1514 02/22/16 2128 02/23/16 0235  HGB 14.2  --   --   --  13.5  HCT 43.8  --   --   --  40.4  PLT 251  --   --   --  193  HEPARINUNFRC  --   --  0.27*  --  0.31  CREATININE 0.87  --   --   --  0.70  TROPONINI  --   < > <0.03 <0.03 <0.03  < > = values in this interval not displayed.  Estimated Creatinine Clearance: 98.6 mL/min (by C-G formula based on Cr of 0.7).  Assessment: 50 yo male with known cardiac disease. Admitted with chest, shoulder pain, numb jaw, nausea and SOB. Has history of CAD s/p PCI in 2010.  HL therapeutic on 1250 units/h. Medically managing for now. Troponins negative. CBC wnl, no bleed   Goal of Therapy:  Heparin level 0.3-0.7 units/ml Monitor platelets by anticoagulation protocol: Yes   Plan:  Heparin to 1250 units/h Daily HL/CBC Monitor s/sx bleeding  Babs Bertin, PharmD, [facility] Clinical Pharmacist Pager [phone] 02/23/2016 9:47 AM

## 2016-02-23 NOTE — Discharge Instructions (Signed)

## 2016-02-23 NOTE — Progress Notes (Signed)
Hospital Problem List     Active Problems:   Chest pain    Patient Profile:   Primary Cardiologist: Dr. Allyson Sabal  50 year old Caucasian male w/ PMH of HTN, HLD, OSA on CPAP and CAD s/p stent to LCx and RCA in 2010 with 70% ramus residual presented with onset of L sided substernal chest pain since 4 AM in the morning of 02/14/2016.  Subjective   Denies any repeat episodes of chest pain overnight. Seen in Nuclear Medicine for 1-day NST.  Inpatient Medications    . aspirin  81 mg Oral Daily  . carvedilol  12.5 mg Oral BID WC  . clopidogrel  75 mg Oral Daily  . gabapentin  200 mg Oral QHS  . Influenza vac split quadrivalent PF  0.5 mL Intramuscular Tomorrow-1000  . isosorbide mononitrate  60 mg Oral Daily  . loratadine  10 mg Oral Daily  . losartan  50 mg Oral Daily  . multivitamin with minerals  1 tablet Oral Daily  . NIFEdipine  30 mg Oral Daily  . pravastatin  20 mg Oral q1800  . regadenoson      . topiramate  100 mg Oral BID    Vital Signs    Filed Vitals:   02/23/16 0904 02/23/16 0908 02/23/16 0910 02/23/16 0911  BP: 139/88 137/94 147/76 139/82  Pulse: 76 112 99 90  Temp:      TempSrc:      Resp:      Height:      Weight:      SpO2:        Intake/Output Summary (Last 24 hours) at 02/23/16 0912 Last data filed at 02/23/16 0600  Gross per 24 hour  Intake 633.25 ml  Output   1000 ml  Net -366.75 ml   Filed Weights   02/22/16 0535 02/22/16 1510 02/23/16 0408  Weight: 155 lb (70.308 kg) 153 lb 11.2 oz (69.718 kg) 151 lb 14.4 oz (68.901 kg)    Physical Exam    General: Well developed, well nourished, male in no acute distress. Head: Normocephalic, atraumatic.  Neck: Supple without bruits, JVD not elevated. Lungs:  Resp regular and unlabored, CTA without wheezing or rales. Heart: RRR, S1, S2, no S3, S4, or murmur; no rub. Abdomen: Soft, non-tender, non-distended with normoactive bowel sounds. No hepatomegaly. No rebound/guarding. No obvious abdominal  masses. Extremities: No clubbing, cyanosis, or edema. Distal pedal pulses are 2+ bilaterally. Neuro: Alert and oriented X 3. Moves all extremities spontaneously. Psych: Normal affect.  Labs    CBC  Recent Labs  02/22/16 0739 02/23/16 0235  WBC 7.5 9.7  HGB 14.2 13.5  HCT 43.8 40.4  MCV 85.5 83.8  PLT 251 193   Basic Metabolic Panel  Recent Labs  02/22/16 0739 02/23/16 0235  NA 140 138  K 5.1 3.7  CL 107 106  CO2 25 22  GLUCOSE 120* 115*  BUN 19 18  CREATININE 0.87 0.70  CALCIUM 9.3 8.5*   Liver Function Tests No results for input(s): AST, ALT, ALKPHOS, BILITOT, PROT, ALBUMIN in the last 72 hours. No results for input(s): LIPASE, AMYLASE in the last 72 hours. Cardiac Enzymes  Recent Labs  02/22/16 1514 02/22/16 2128 02/23/16 0235  TROPONINI <0.03 <0.03 <0.03   Fasting Lipid Panel  Recent Labs  02/23/16 0235  CHOL 135  HDL 40*  LDLCALC 79  TRIG 82  CHOLHDL 3.4    Telemetry    Not reviewed. Seen in Nuclear Medicine.  ECG  NSR, HR 61, No acute ST or T-wave changes.   Cardiac Studies and Radiology    Dg Chest 2 View: 02/22/2016  CLINICAL DATA:  Acute onset of generalized chest pain and shortness of breath. Initial encounter. EXAM: CHEST  2 VIEW COMPARISON:  Chest radiograph from 05/04/2015 FINDINGS: The lungs are well-aerated and clear. There is no evidence of focal opacification, pleural effusion or pneumothorax. The heart is normal in size; the mediastinal contour is within normal limits. No acute osseous abnormalities are seen. Clips are noted within the right upper quadrant, reflecting prior cholecystectomy. IMPRESSION: No acute cardiopulmonary process seen. Electronically Signed   By: Roanna Raider M.D.   On: 02/22/2016 06:12    Echocardiogram: 02/22/2016 Study Conclusions - Left ventricle: The cavity size was normal. Wall thickness was increased in a pattern of mild LVH. Systolic function was normal. The estimated ejection fraction was  in the range of 60% to 65%. Although no diagnostic regional wall motion abnormality was identified, this possibility cannot be completely excluded on the basis of this study. Features are consistent with a pseudonormal left ventricular filling pattern, with concomitant abnormal relaxation and increased filling pressure (grade 2 diastolic dysfunction). - Aortic valve: There was no stenosis. - Mitral valve: There was trivial regurgitation. - Right ventricle: The cavity size was normal. Systolic function was normal. - Pulmonary arteries: No complete TR doppler jet so unable to estimate PA systolic pressure. - Inferior vena cava: The vessel was normal in size. The respirophasic diameter changes were in the normal range (>= 50%), consistent with normal central venous pressure. - Pericardium, extracardiac: A trivial pericardial effusion was identified.  Impressions: - Normal LV size with mild LV hypertrophy. Moderate diastolic dysfunction. EF 60-65%. Normal RV size and systolic function. No significant valvular abnormalities.   Assessment & Plan    1. Chest pain/ History of CAD - s/p stent to LCx and RCA in 2010 - repeat cath in 2011, 2012, 2013 and Jan 2015 without reocclusion of the stent. - cyclic troponin values have been negative. Echo shows EF of 60-65% with Grade 2 DD. - chest pain resolved overnight. Seen in Nuclear Medicine for 1-day NST. Results pending by [facility] Radiology.   2. HTN - BP has been 120/76 - 147/96 in the past 24 hours. - continue current medication regimen.  3. HLD - continue statin therapy.  4. OSA  - on CPAP   Signed, Ellsworth Lennox , [place] 9:12 AM 02/23/2016 Pager: [phone]  Patient seen and examined. Agree with assessment and plan. No recurrent chest pain.  DC heparin.  Just back from nuclear study. Data not yet available. ? Dc later today if low risk.   Lennette Bihari, MD, [facility] 02/23/2016 12:28 PM

## 2016-02-23 NOTE — Discharge Summary (Signed)
Discharge Summary    Patient ID: Kyle Chavez,  MRN: [id number], DOB/AGE: May 18, 1966 49 y.o.  Admit date: 02/22/2016 Discharge date: 02/23/2016  Primary Care Provider: Ann Held Primary Cardiologist: Dr. Gwenlyn Found  Discharge Diagnoses    Principal Problem:   Chest pain Active Problems:   CAD, staged PCI with DES 7/10, (PLA and CFX), relook, 2 OK, Nuc low risk 4/12   HTN (hypertension)   Dyslipidemia   OSA on CPAP   Allergies No Known Allergies  Diagnostic Studies/Procedures    Echo 02/22/2016 LV EF: 60% -  65%  ------------------------------------------------------------------- Indications:   Chest pain 786.51.  ------------------------------------------------------------------- History:  PMH:  Angina pectoris. Coronary artery disease. Risk factors: Hypertension. Dyslipidemia.  ------------------------------------------------------------------- Study Conclusions  - Left ventricle: The cavity size was normal. Wall thickness was increased in a pattern of mild LVH. Systolic function was normal. The estimated ejection fraction was in the range of 60% to 65%. Although no diagnostic regional wall motion abnormality was identified, this possibility cannot be completely excluded on the basis of this study. Features are consistent with a pseudonormal left ventricular filling pattern, with concomitant abnormal relaxation and increased filling pressure (grade 2 diastolic dysfunction). - Aortic valve: There was no stenosis. - Mitral valve: There was trivial regurgitation. - Right ventricle: The cavity size was normal. Systolic function was normal. - Pulmonary arteries: No complete TR doppler jet so unable to estimate PA systolic pressure. - Inferior vena cava: The vessel was normal in size. The respirophasic diameter changes were in the normal range (>= 50%), consistent with normal central venous pressure. - Pericardium,  extracardiac: A trivial pericardial effusion was identified.  Impressions:  - Normal LV size with mild LV hypertrophy. Moderate diastolic dysfunction. EF 60-65%. Normal RV size and systolic function. No significant valvular abnormalities.   Lexiscan myoview 02/23/2016 IMPRESSION: 1. No reversible ischemia or infarction.  2. Normal left ventricular wall motion.  3. Left ventricular ejection fraction 69%  4. Low-risk stress test findings*. _____________   History of Present Illness     Kyle Chavez is a 50 year old Caucasian male with past medical history of HTN, HLD, OSA on CPAP and CAD s/p stent to LCx and RCA in 2010. Since then, he has had repeat cardiac catheterization in 2011, 2012, 2013 and lastly January 2015 at [facility]. He has a known 70% residual in ramus which was treated medically. He has otherwise been compliant with aspirin and Plavix. He does have occasional chest discomfort recently lasting 5 minutes each occurring at rest. He denies any recent exertion. He denies any recent fever, chill, cough, lower extremity edema, orthopnea or paroxysmal nocturnal dyspnea.  He got up around 2:30 AM in the morning of 02/22/2016 going to work. 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 On his way back around 4 AM this morning, he started having left-sided chest discomfort radiating to the neck jaw and the left shoulder. He also has diaphoresis as well. The chest discomfort is similar to his previous symptom prior to the regional MI in 2010. This has prompted the patient to seek medical attention at [facility]. Initial EKG was interpreted as normal, however does show an elevated J-point in the inferior lead and also V6. Repeat EKG continued to show the same without significant progression. Troponin was normal. CBC and be met was normal on arrival as well. Chest x-ray showed no significant acute intrapulmonary etiology. Cardiology has been consulted for  chest pain.  At  the time of interview, patient has been persistently having chest pain for the past 5 hours without relief. Initial troponin was obtained 3 hours and 40 minutes after the onset of chest discomfort which was negative. He states he did take 3 sublingual nitroglycerin at home which improved the pain however has never completely taking anyway.  Hospital Course     Given the J-point elevation in the inferior lead, we obtained a stat echocardiogram which came back normal with normal EF. Interestingly, he did not have J-point elevation on the previous EKG back in 2016, we also did repeat serial troponin which all came back negative. Therefore, we did not think this is inferior STEMI but instead this is likely represent early repolarization. He underwent Lexiscan Myoview on 02/23/2016 which came back normal without significant ischemia. He is deemed stable for discharge from cardiology perspective. He has a follow-up with Dr. Gwenlyn Found next week. _____________  Discharge Vitals Blood pressure 122/68, pulse 63, temperature 98.4 F (36.9 C), temperature source Oral, resp. rate 15, height 5' 6" (1.676 m), weight 151 lb 14.4 oz (68.901 kg), SpO2 99 %.  Filed Weights   02/22/16 0535 02/22/16 1510 02/23/16 0408  Weight: 155 lb (70.308 kg) 153 lb 11.2 oz (69.718 kg) 151 lb 14.4 oz (68.901 kg)    Labs & Radiologic Studies     CBC  Recent Labs  02/22/16 0739 02/23/16 0235  WBC 7.5 9.7  HGB 14.2 13.5  HCT 43.8 40.4  MCV 85.5 83.8  PLT 251 643   Basic Metabolic Panel  Recent Labs  02/22/16 0739 02/23/16 0235  NA 140 138  K 5.1 3.7  CL 107 106  CO2 25 22  GLUCOSE 120* 115*  BUN 19 18  CREATININE 0.87 0.70  CALCIUM 9.3 8.5*   Cardiac Enzymes  Recent Labs  02/22/16 1514 02/22/16 2128 02/23/16 0235  TROPONINI <0.03 <0.03 <0.03   Fasting Lipid Panel  Recent Labs  02/23/16 0235  CHOL 135  HDL 40*  LDLCALC 79  TRIG 82  CHOLHDL 3.4   Thyroid Function Tests No  results for input(s): TSH, T4TOTAL, T3FREE, THYROIDAB in the last 72 hours.  Invalid input(s): FREET3  Dg Chest 2 View  02/22/2016  CLINICAL DATA:  Acute onset of generalized chest pain and shortness of breath. Initial encounter. EXAM: CHEST  2 VIEW COMPARISON:  Chest radiograph from 05/04/2015 FINDINGS: The lungs are well-aerated and clear. There is no evidence of focal opacification, pleural effusion or pneumothorax. The heart is normal in size; the mediastinal contour is within normal limits. No acute osseous abnormalities are seen. Clips are noted within the right upper quadrant, reflecting prior cholecystectomy. IMPRESSION: No acute cardiopulmonary process seen. Electronically Signed   By: Garald Balding M.D.   On: 02/22/2016 06:12   Nm Myocar Multi W/spect W/wall Motion / Ef  02/23/2016  CLINICAL DATA:  Chest pain, angina, CAD status post MI in 2010 with angioplasty/stent EXAM: MYOCARDIAL IMAGING WITH SPECT (REST AND PHARMACOLOGIC-STRESS) GATED LEFT VENTRICULAR WALL MOTION STUDY LEFT VENTRICULAR EJECTION FRACTION TECHNIQUE: Standard myocardial SPECT imaging was performed after resting intravenous injection of 10 mCi [id number]. Subsequently, intravenous infusion of Lexiscan was performed under the supervision of the Cardiology staff. At peak effect of the drug, 30 mCi Tc-990mestamibi was injected intravenously and standard myocardial SPECT imaging was performed. Quantitative gated imaging was also performed to evaluate left ventricular wall motion, and estimate left ventricular ejection fraction. COMPARISON:  05/04/2015 FINDINGS: Perfusion: No decreased activity in the left ventricle  on stress imaging to suggest reversible ischemia or infarction. Wall Motion: Normal left ventricular wall motion. No left ventricular dilation. Left Ventricular Ejection Fraction: 69 % End diastolic volume 83 ml End systolic volume 26 ml IMPRESSION: 1. No reversible ischemia or infarction. 2. Normal left ventricular  wall motion. 3. Left ventricular ejection fraction 69% 4. Low-risk stress test findings*. *2012 Appropriate Use Criteria for Coronary Revascularization Focused Update: J Am Coll Cardiol. 5726;20(3):559-741. http://content.airportbarriers.com.aspx?articleid=[email or web address] Electronically Signed   By: Julian Hy M.D.   On: 02/23/2016 11:28    Disposition   Pt is being discharged home today in good condition.  Follow-up Plans & Appointments    Follow-up Information    Follow up with Quay Burow, MD On 03/01/2016.   Specialties:  Cardiology, Radiology   Why:  9:00AM   Contact information:   [address] [place] [address] [phone]      Discharge Instructions    Diet - low sodium heart healthy    Complete by:  As directed      Increase activity slowly    Complete by:  As directed            Discharge Medications   Current Discharge Medication List    CONTINUE these medications which have NOT CHANGED   Details  aspirin 81 MG chewable tablet Chew 81 mg by mouth daily.    carvedilol (COREG) 12.5 MG tablet TAKE 1 TABLET BY MOUTH 2 TIMES DAILY WITH A MEAL. Qty: 60 tablet, Refills: 11    clopidogrel (PLAVIX) 75 MG tablet Take 1 tablet (75 mg total) by mouth daily. Qty: 30 tablet, Refills: 11    famotidine (PEPCID) 40 MG tablet Take 1 tablet (40 mg total) by mouth daily. NEED APPT FOR FUTURE REFILLS Qty: 30 tablet, Refills: 9    fexofenadine (ALLEGRA) 180 MG tablet Take 180 mg by mouth daily.    gabapentin (NEURONTIN) 100 MG capsule Take 200 mg by mouth at bedtime. Refills: 2    isosorbide mononitrate (IMDUR) 60 MG 24 hr tablet Take 1 tablet (60 mg total) by mouth daily. Qty: 30 tablet, Refills: 11    losartan (COZAAR) 50 MG tablet Take 1 tablet (50 mg total) by mouth daily. Qty: 30 tablet, Refills: 11    Multiple Vitamin (MULTIVITAMIN) tablet Take 1 tablet by mouth daily.    NIFEdipine (PROCARDIA-XL/ADALAT-CC/NIFEDICAL-XL) 30 MG 24 hr tablet  Take 1 tablet (30 mg total) by mouth daily. Qty: 30 tablet, Refills: 2    nitroGLYCERIN (NITROSTAT) 0.4 MG SL tablet Place 0.4 mg under the tongue every 5 (five) minutes as needed for chest pain.    pravastatin (PRAVACHOL) 20 MG tablet TAKE 1 TABLET (20 MG TOTAL) BY MOUTH DAILY. Qty: 30 tablet, Refills: 11    topiramate (TOPAMAX) 100 MG tablet Take 100 mg by mouth 2 (two) times daily.           Outstanding Labs/Studies   None  Duration of Discharge Encounter   Greater than 30 minutes including physician time.  Signed, Almyra Deforest PA-C 02/23/2016, 3:19 PM

## 2016-02-23 NOTE — Progress Notes (Signed)
ANTICOAGULATION CONSULT NOTE - Follow Up Consult  Pharmacy Consult for Heparin  Indication: chest pain/ACS  No Known Allergies  Patient Measurements: Height:  (167.6 cm) Weight: 153 lb 11.2 oz (69.718 kg) IBW/kg (Calculated) : 63.8  Vital Signs: Temp: 97.8 F (36.6 C) (03/01 0000) Temp Source: Oral (03/01 0000) BP: 143/91 mmHg (02/28 1730) Pulse Rate: 60 (03/01 0000)  Labs:  Recent Labs  02/22/16 0739  02/22/16 1055 02/22/16 1514 02/22/16 2128 02/23/16 0235  HGB 14.2  --   --   --   --  13.5  HCT 43.8  --   --   --   --  40.4  PLT 251  --   --   --   --  193  HEPARINUNFRC  --   --   --  0.27*  --  0.31  CREATININE 0.87  --   --   --   --   --   TROPONINI  --   < > <0.03 <0.03 <0.03  --   < > = values in this interval not displayed.  Estimated Creatinine Clearance: 90.6 mL/min (by C-G formula based on Cr of 0.87).  Assessment: Heparin for CP, considering NST vs cath, heparin level is on low end of therapeutic range  Goal of Therapy:  Heparin level 0.3-0.7 units/ml Monitor platelets by anticoagulation protocol: Yes   Plan:  -Increase heparin to 1250 units/hr to prevent sub-therapeutic level -Confirmatory HL at 1100   Kyle Chavez 02/23/2016,3:29 AM

## 2016-03-01 ENCOUNTER — Encounter: Payer: Self-pay | Admitting: Cardiovascular Disease

## 2016-03-01 ENCOUNTER — Ambulatory Visit (INDEPENDENT_AMBULATORY_CARE_PROVIDER_SITE_OTHER): Payer: 59 | Admitting: Cardiovascular Disease

## 2016-03-01 ENCOUNTER — Ambulatory Visit: Payer: 59 | Admitting: Cardiovascular Disease

## 2016-03-01 VITALS — BP 132/82 | HR 80 | Ht 66.0 in | Wt 156.0 lb

## 2016-03-01 DIAGNOSIS — I10 Essential (primary) hypertension: Secondary | ICD-10-CM | POA: Diagnosis not present

## 2016-03-01 DIAGNOSIS — I2510 Atherosclerotic heart disease of native coronary artery without angina pectoris: Secondary | ICD-10-CM

## 2016-03-01 DIAGNOSIS — E785 Hyperlipidemia, unspecified: Secondary | ICD-10-CM | POA: Diagnosis not present

## 2016-03-01 DIAGNOSIS — I2583 Coronary atherosclerosis due to lipid rich plaque: Principal | ICD-10-CM

## 2016-03-01 MED ORDER — NITROGLYCERIN 0.4 MG SL SUBL: 0.4000 mg | SUBLINGUAL_TABLET | SUBLINGUAL | Status: DC | PRN

## 2016-03-01 MED ORDER — ISOSORBIDE MONONITRATE ER 60 MG PO TB24: 60.0000 mg | ORAL_TABLET | Freq: Every day | ORAL | Status: DC

## 2016-03-01 MED ORDER — LOSARTAN POTASSIUM 50 MG PO TABS: 50.0000 mg | ORAL_TABLET | Freq: Every day | ORAL | Status: DC

## 2016-03-01 MED ORDER — CLOPIDOGREL BISULFATE 75 MG PO TABS: 75.0000 mg | ORAL_TABLET | Freq: Every day | ORAL | Status: DC

## 2016-03-01 MED ORDER — CARVEDILOL 12.5 MG PO TABS: ORAL_TABLET | ORAL | Status: DC

## 2016-03-01 NOTE — Assessment & Plan Note (Signed)
History of hypertension blood pressure measured at 132/82. He is on carvedilol and losartan. Continued current meds at current dosing

## 2016-03-01 NOTE — Assessment & Plan Note (Signed)
History of CAD status post 2 vessel PCI and stenting by myself. The right radial approach July 2010 of the circumflex and right coronary artery. He did have moderately diffuse disease of his ramus branch which was 70% and treated medically.  He was admitted to the hospital with chest pain 02/22/16 through 02/23/16. He ruled out for myocardial infarction. 2-D echo was normal. A Myoview stress test showed no ischemia. His enzymes were negative and he was discharged home. He has had no recurrent symptoms.

## 2016-03-01 NOTE — Progress Notes (Signed)
03/01/2016 AUM CAGGIANO   Sep 28, 1966  [id number]  Primary Physician Lucila Maine, MD Primary Cardiologist: Runell Gess MD [place], FSCAI   HPI:  Kyle Chavez is a 50 year old.. male who presents for posthospital follow-up. I last saw him in the office one year ago.  Patient has a history of coronary artery disease with two-vessel PCI and stenting via the right radial approach in July of 2010 this was the circumflex artery in his right coronary artery. He did have moderately diffuse disease of the ramus branch of 70% which was treated medically. He does have obstructive sleep apnea on CPAP, he has hypertension, hyperlipidemia and a strong family history of heart disease. Recently his father had ICD placed. His father had an MI in his early 15s, his paternal grandfather died of an MI at 53yo and his maternal grandfather died of an MI at 78yo.  He was catheterized by Dr. Herbie Baltimore on April 4th 2012 revealing patent stents and a diffusely diseased ramus branch, unchanged from previous. He was then catheterized by Dr. Allyson Sabal on March 05 2012 which showed similar anatomy.  In June 2014 he underwent exercise nuclear study which was normal no ischemia and normal LV wall motion. 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  Patient reports that in December 2014, he started having chest pressure radiating to his left arm and jaw, lasting up to 30 minutes and recurring after nitroglycerin. This was prompted by exertion but did not fully subside at rest. He went to [facility] where he was transferred to [facility] for cardiac cath despite patient's request to be transferred to [facility] where his cardiologist group is. Patient reportedly underwent cath which did not show anything new or acute. He was discharged home without any medication change.   He recently was admitted to [facility] on 02/22/16 through 02/23/16 with chest pain. He ruled out for myocardial  infarction. A Myoview stress test performed 02/23/16 was nonischemic and a 2-D echo was normal. He was discharged home and has had no recurrent symptoms.  Since then, he reports having occasional chest pressure, lasting a few minutes and resolving on its own, not requiring nitro. Pain occurs with exertion, not associated with shortness of breath, palpitations, nausea, diaphoresis or radiation to the arm and jaw. These episodes occur a couple of times per week.  He reports some dyspnea with going up a flight of stairs. He denies any lower extremity swelling. No orthopnea.  He wears his CPAP machine intermittently when he doesn't forget to wear it. He doesn't exercise. 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 He has never smoked. He is compliant with his medications.   Since he was seen a year ago he's had occasional atypical chest pain which has not changed in frequency or severity.   Current Outpatient Prescriptions  Medication Sig Dispense Refill  . aspirin 81 MG chewable tablet Chew 81 mg by mouth daily.    . carvedilol (COREG) 12.5 MG tablet TAKE 1 TABLET BY MOUTH 2 TIMES DAILY WITH A MEAL. 60 tablet 11  . clopidogrel (PLAVIX) 75 MG tablet Take 1 tablet (75 mg total) by mouth daily. 30 tablet 11  . famotidine (PEPCID) 40 MG tablet Take 1 tablet (40 mg total) by mouth daily. NEED APPT FOR FUTURE REFILLS 30 tablet 9  . fexofenadine (ALLEGRA) 180 MG tablet Take 180 mg by mouth daily.    Marland Kitchen gabapentin (  NEURONTIN) 100 MG capsule Take 200 mg by mouth at bedtime.  2  . isosorbide mononitrate (IMDUR) 60 MG 24 hr tablet Take 1 tablet (60 mg total) by mouth daily. 30 tablet 11  . losartan (COZAAR) 50 MG tablet Take 1 tablet (50 mg total) by mouth daily. 30 tablet 11  . Multiple Vitamin (MULTIVITAMIN) tablet Take 1 tablet by mouth daily.    Marland Kitchen NIFEdipine (PROCARDIA-XL/ADALAT-CC/NIFEDICAL-XL) 30 MG 24 hr tablet Take 1 tablet (30 mg total) by mouth daily. 30 tablet 2  .  nitroGLYCERIN (NITROSTAT) 0.4 MG SL tablet Place 1 tablet (0.4 mg total) under the tongue every 5 (five) minutes as needed for chest pain. 25 tablet 3  . pravastatin (PRAVACHOL) 20 MG tablet TAKE 1 TABLET (20 MG TOTAL) BY MOUTH DAILY. 30 tablet 11  . topiramate (TOPAMAX) 100 MG tablet Take 100 mg by mouth 2 (two) times daily.     No current facility-administered medications for this visit.    No Known Allergies  Social History   Social History  . Marital Status: Married    Spouse Name: N/A  . Number of Children: 1  . Years of Education: N/A   Occupational History  . 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     Social History Main Topics  . Smoking status: Never Smoker   . Smokeless tobacco: Never Used  . Alcohol Use: No     Comment: "quit drinking ~ 2010"  . Drug Use: No  . Sexual Activity: Not Currently   Other Topics Concern  . Not on file   Social History Narrative      Pt lives at home with his spouse.   Caffeine Use- Drinks soda daily     Review of Systems: General: negative for chills, fever, night sweats or weight changes.  Cardiovascular: negative for chest pain, dyspnea on exertion, edema, orthopnea, palpitations, paroxysmal nocturnal dyspnea or shortness of breath Dermatological: negative for rash Respiratory: negative for cough or wheezing Urologic: negative for hematuria Abdominal: negative for nausea, vomiting, diarrhea, bright red blood per rectum, melena, or hematemesis Neurologic: negative for visual changes, syncope, or dizziness All other systems reviewed and are otherwise negative except as noted above.    Blood pressure 132/82, pulse 80, height  (1.676 m), weight 156 lb (70.761 kg).  General appearance: alert and no distress Neck: no adenopathy, no carotid bruit, no JVD, supple, symmetrical, trachea midline and thyroid not enlarged, symmetric, no tenderness/mass/nodules Lungs: clear to auscultation bilaterally Heart: regular rate and  rhythm, S1, S2 normal, no murmur, click, rub or gallop Extremities: extremities normal, atraumatic, no cyanosis or edema  EKG not performed today  ASSESSMENT AND PLAN:   CAD, staged PCI with DES 7/10, (PLA and CFX), relook, 2 OK, Nuc low risk 4/12 History of CAD status post 2 vessel PCI and stenting by myself. The right radial approach July 2010 of the circumflex and right coronary artery. He did have moderately diffuse disease of his ramus branch which was 70% and treated medically.  He was admitted to the hospital with chest pain 02/22/16 through 02/23/16. He ruled out for myocardial infarction. 2-D echo was normal. A Myoview stress test showed no ischemia. His enzymes were negative and he was discharged home. He has had no recurrent symptoms.  HTN (hypertension) History of hypertension blood pressure measured at 132/82. He is on carvedilol and losartan. Continued current meds at current dosing  Dyslipidemia History of hyperlipidemia on pravastatin with recent lipid  profile performed 02/23/16 revealed a total cholesterol of 35 with LDL 79 and HDL of 40      Runell GessJonathan J. Lagina Reader MD [facility] County Memorial HospitalFACP,FACC,FAHA, [facility] HospitalFSCAI 03/01/2016 12:21 PM

## 2016-03-01 NOTE — Patient Instructions (Signed)

## 2016-03-01 NOTE — Assessment & Plan Note (Signed)
History of hyperlipidemia on pravastatin with recent lipid profile performed 02/23/16 revealed a total cholesterol of 35 with LDL 79 and HDL of 40

## 2016-03-17 ENCOUNTER — Other Ambulatory Visit: Payer: Self-pay | Admitting: Cardiovascular Disease

## 2016-03-17 NOTE — Telephone Encounter (Signed)
Rx request sent to pharmacy.  

## 2016-03-19 ENCOUNTER — Other Ambulatory Visit: Payer: Self-pay | Admitting: Cardiovascular Disease

## 2016-03-26 ENCOUNTER — Other Ambulatory Visit: Payer: Self-pay | Admitting: Cardiovascular Disease

## 2016-03-28 NOTE — Telephone Encounter (Signed)
Rx(s) sent to pharmacy electronically.  

## 2016-07-27 ENCOUNTER — Encounter: Payer: Self-pay | Admitting: Cardiovascular Disease

## 2016-07-27 ENCOUNTER — Telehealth: Payer: Self-pay | Admitting: Cardiovascular Disease

## 2016-07-27 NOTE — Telephone Encounter (Signed)
I think follow-up with [facility] makes a lot of sense These symptoms don't really sound very cardiac in nature. However I think because of increasing anxiety makes more sense to prevent re-seen sooner. Would keep his September appointment with Dr. Allyson Sabal however.  Bryan Lemma, MD

## 2016-07-27 NOTE — Telephone Encounter (Signed)
New message       Pt c/o of Chest Pain: STAT if CP now or developed within 24 hours  1. Are you having CP right now? Wife said "hurting a little" 2. Are you experiencing any other symptoms (ex. SOB, nausea, vomiting, sweating)? Wife said little sob 3. How long have you been experiencing CP? Pt went to [facility] yesterday but they did not keep him 4. Is your CP continuous or coming and going?  Wife not sure 5. Have you taken Nitroglycerin? Took 2 yesterday morning?  Please call pt on cell [phone] or hm [phone]

## 2016-07-27 NOTE — Telephone Encounter (Signed)
Spoke w/ patient.  C/o CP yesterday, shoulder pain today. Seems like "whole left side of my chest is swollen up, compared to my right side". Denies radiation of pain to arms, neck, etc. No nausea, no SOB, no dizziness or diaphoresis.  Pt went to [facility] ED yesterday for CP eval and was sent home after normal workup. Labwork, EKG, CXR, etc all fine according to patient, he was instructed to f/u w his cardiologist. He notes the CP seems better today but he is having pain in his shoulder.  Pt asking for advice - I recommended if any new/worse symptoms such as radiating pain, SOB, etc to go to ER esp if pain unrelieved w/ NTG.  Notes he lost his dad in Jan to causes which were cardiac in nature. He notes fam hx anxiety. Pt and I discussed - he has upcoming appt on 9/8 but would benefit from sooner appt., we scheduled tomorrow 8/4 for appt at 11:00am w Norma Fredrickson. Pt given appt information and driving instructions for reaching [facility] office  Pt voiced thanks for the call and assistance.  Routed to [employer] for any additional recommendations.

## 2016-07-28 ENCOUNTER — Ambulatory Visit (INDEPENDENT_AMBULATORY_CARE_PROVIDER_SITE_OTHER): Payer: 59 | Admitting: Nurse Practitioner

## 2016-07-28 ENCOUNTER — Other Ambulatory Visit: Payer: Self-pay | Admitting: Nurse Practitioner

## 2016-07-28 ENCOUNTER — Encounter: Payer: Self-pay | Admitting: Nurse Practitioner

## 2016-07-28 VITALS — BP 122/78 | HR 65 | Ht 66.0 in | Wt 161.0 lb

## 2016-07-28 DIAGNOSIS — I2510 Atherosclerotic heart disease of native coronary artery without angina pectoris: Secondary | ICD-10-CM | POA: Diagnosis not present

## 2016-07-28 DIAGNOSIS — I10 Essential (primary) hypertension: Secondary | ICD-10-CM | POA: Diagnosis not present

## 2016-07-28 DIAGNOSIS — E785 Hyperlipidemia, unspecified: Secondary | ICD-10-CM

## 2016-07-28 DIAGNOSIS — I2583 Coronary atherosclerosis due to lipid rich plaque: Principal | ICD-10-CM

## 2016-07-28 LAB — CBC
HCT: 43.6 % (ref 38.5–50.0)
Hemoglobin: 15.4 g/dL (ref 13.2–17.1)
MCH: 29.7 pg (ref 27.0–33.0)
MCHC: 35.3 g/dL (ref 32.0–36.0)
MCV: 84 fL (ref 80.0–100.0)
MPV: 10.7 fL (ref 7.5–12.5)
Platelets: 258 10*3/uL (ref 140–400)
RBC: 5.19 MIL/uL (ref 4.20–5.80)
RDW: 14.7 % (ref 11.0–15.0)
WBC: 8.6 10*3/uL (ref 3.8–10.8)

## 2016-07-28 LAB — BASIC METABOLIC PANEL
BUN: 18 mg/dL (ref 7–25)
CO2: 22 mmol/L (ref 20–31)
Calcium: 8.8 mg/dL (ref 8.6–10.3)
Chloride: 106 mmol/L (ref 98–110)
Creat: 0.72 mg/dL (ref 0.70–1.33)
Glucose, Bld: 93 mg/dL (ref 65–99)
Potassium: 4.2 mmol/L (ref 3.5–5.3)
Sodium: 138 mmol/L (ref 135–146)

## 2016-07-28 LAB — APTT: aPTT: 29 s (ref 22–34)

## 2016-07-28 LAB — PROTIME-INR
INR: 1
Prothrombin Time: 10.9 s (ref 9.0–11.5)

## 2016-07-28 MED ORDER — ISOSORBIDE MONONITRATE ER 60 MG PO TB24: ORAL_TABLET | ORAL | 6 refills | Status: DC

## 2016-07-28 NOTE — Patient Instructions (Addendum)
We will be checking the following labs today - BMET, CBC, PT, PTT   Medication Instructions:    Continue with your current medicines. BUT  I am increasing the Imdur to 90 mg a day    Testing/Procedures To Be Arranged:  Cardiac catheterization for early next week     Other Special Instructions:   Your provider has recommended a cardiac catherization  You are scheduled for a cardiac catheterization on Monday, August 7th at 7:30 AM with Dr. Katrinka Blazing or associate.  Go to [facility] 2nd Floor Short Stay on Monday, August 7th at 5:30 AM.  Enter thru the [facility] entrance A No food or drink after midnight on Sunday. You may take your medications with a sip of water on the day of your procedure.   Coronary Angiogram A coronary angiogram, also called coronary angiography, is an X-ray procedure used to look at the arteries in the heart. In this procedure, a dye (contrast dye) is injected through a long, hollow tube (catheter). The catheter is about the size of a piece of cooked spaghetti and is inserted through your groin, wrist, or arm. The dye is injected into each artery, and X-rays are then taken to show if there is a blockage in the arteries of your heart.  LET [facility] CARE PROVIDER KNOW ABOUT: Any allergies you have, including allergies to shellfish or contrast dye.  All medicines you are taking, including vitamins, herbs, eye drops, creams, and over-the-counter medicines.  Previous problems you or members of your family have had with the use of anesthetics.  Any blood disorders you have.  Previous surgeries you have had. History of kidney problems or failure.  Other medical conditions you have.  RISKS AND COMPLICATIONS  Generally, a coronary angiogram is a safe procedure. However, about 1 person out of 1000 can have problems that may include: Allergic reaction to the dye. Bleeding/bruising from the access site or other locations. Kidney injury, especially in  people with impaired kidney function. Stroke (rare). Heart attack (rare). Irregular rhythms (rare) Death (rare)  BEFORE THE PROCEDURE  Do not eat or drink anything after midnight the night before the procedure or as directed by your health care provider.  Ask your health care provider about changing or stopping your regular medicines. This is especially important if you are taking diabetes medicines or blood thinners.  PROCEDURE You may be given a medicine to help you relax (sedative) before the procedure. This medicine is given through an intravenous (IV) access tube that is inserted into one of your veins.  The area where the catheter will be inserted will be washed and shaved. This is usually done in the groin but may be done in the fold of your arm (near your elbow) or in the wrist.  A medicine will be given to numb the area where the catheter will be inserted (local anesthetic).  The health care provider will insert the catheter into an artery. The catheter will be guided by using a special type of X-ray (fluoroscopy) of the blood vessel being examined.  A special dye will then be injected into the catheter, and X-rays will be taken. The dye will help to show where any narrowing or blockages are located in the heart arteries.    AFTER THE PROCEDURE  If the procedure is done through the leg, you will be kept in bed lying flat for several hours. You will be instructed to not bend or cross your legs. The insertion site will  be checked frequently.  The pulse in your feet or wrist will be checked frequently.  Additional blood tests, X-rays, and an electrocardiogram may be done.         If you need a refill on your cardiac medications before your next appointment, please call your pharmacy.   Call the [facility] office at [phone] if you have any questions, problems or concerns.

## 2016-07-28 NOTE — Telephone Encounter (Signed)
Acknowledged.

## 2016-07-28 NOTE — Progress Notes (Signed)
CARDIOLOGY OFFICE NOTE  Date:  07/28/2016    Kyle Chavez Date of Birth: 09/01/1966 Medical Record #[id number]  PCP:  Lucila MaineSCOTT, ROBERT, MD  Cardiologist:  Allyson SabalBerry  Chief Complaint  Patient presents with  . Chest Pain    [facility] seen for Dr. Allyson SabalBerry    History of Present Illness: Kyle Chavez is a 50 y.o. male who presents today for a post ER visit from [facility].   He has a history of CAD, Kyle, HLD, OSA and HTN.  He has had prior 2 vessel PCI and stenting via right radial approach July 2010 of the circumflex and right coronary artery. He did have moderately diffuse disease of his ramus branch which was 70% and treated medically.  [facility] and he was told this was ok.   He was admitted to the hospital with chest pain 02/22/16 through 02/23/16. He ruled out for myocardial infarction. 2-D echo was normal. A Myoview stress test showed no ischemia. His enzymes were negative and he was discharged home.   Last seen back in March following that admission.   Comes in today. Here with his wife. In the [facility] 2 days ago. Has had what he says was right sided chest pain - but rubbing his left side chest and tells me it has been all on the left side. Started 2 days ago. Taking NTG - did this on Wednesday Am - some relief. Not much done there. Sounds like his labs were ok and he ruled out for MI. His symptoms have persisted. Worse with exertion. Very similar to prior chest pain syndrome. No current symptoms at time of exam. No more NTG used. He is very inactive.   Past Medical History:  Diagnosis Date  . Angina   . CAD (coronary artery disease)   . Dyslipidemia   . Kyle (erectile dysfunction)   . Family history of heart disease   . GERD (gastroesophageal reflux disease)   . History of nuclear stress test 04/18/2011   exercise myoview; normal pattern of perfusion in all regions; low risk scan   . Hypertension   . Migraine 03/05/12   "still  have them every now and then"  . Myocardial infarction (HCC) 2010  . OSA on CPAP     Past Surgical History:  Procedure Laterality Date  . CARDIAC CATHETERIZATION  03/05/12   patent stents, diffusely disease ramus branch  . CHOLECYSTECTOMY  ?1990's  . CORONARY ANGIOPLASTY WITH STENT PLACEMENT  07/09/2009   PCI & stenting with Xience DES of circumflex & RCA  . LACERATION REPAIR  ~ 2009   right thumb  . LEFT HEART CATHETERIZATION WITH CORONARY ANGIOGRAM N/A 03/05/2012   Procedure: LEFT HEART CATHETERIZATION WITH CORONARY ANGIOGRAM;  Surgeon: Runell GessJonathan J Berry, MD;  [facility];  Service: Cardiovascular;  Laterality: N/A;  . Rt arm surgery  1960's   "caught in washing machine; have had 2 skin grafts there"  . TONSILLECTOMY AND ADENOIDECTOMY     "when I was a kid"     Medications: Current Outpatient Prescriptions  Medication Sig Dispense Refill  . aspirin 81 MG chewable tablet Chew 81 mg by mouth daily.    . carvedilol (COREG) 12.5 MG tablet TAKE 1 TABLET BY MOUTH 2 TIMES DAILY WITH A MEAL. 60 tablet 11  . clopidogrel (PLAVIX) 75 MG tablet Take 1 tablet (75 mg total) by mouth daily. 30 tablet 11  .  famotidine (PEPCID) 40 MG tablet Take 1 tablet (40 mg total) by mouth daily. 30 tablet 9  . fexofenadine (ALLEGRA) 180 MG tablet Take 180 mg by mouth daily.    Marland Kitchen gabapentin (NEURONTIN) 100 MG capsule Take 200 mg by mouth at bedtime.  2  . isosorbide mononitrate (IMDUR) 60 MG 24 hr tablet TAKE 1 1/2 TABLET (90 MG TOTAL) BY MOUTH DAILY. 30 tablet 6  . ketorolac (TORADOL) 10 MG tablet Take 10 mg by mouth every 6 (six) hours as needed for moderate pain (headache).    . losartan (COZAAR) 50 MG tablet Take 1 tablet (50 mg total) by mouth daily. 30 tablet 11  . Multiple Vitamin (MULTIVITAMIN) tablet Take 1 tablet by mouth daily.    Marland Kitchen NIFEdipine (PROCARDIA-XL/ADALAT-CC/NIFEDICAL-XL) 30 MG 24 hr tablet TAKE 1 TABLET (30 MG TOTAL) BY MOUTH DAILY. MUST MAKE APPOINTMENT FOR FUTURE REFILLS (Patient  taking differently: TAKE 1 TABLET (30 MG TOTAL) BY MOUTH DAILY.) 30 tablet 11  . nitroGLYCERIN (NITROSTAT) 0.4 MG SL tablet Place 1 tablet (0.4 mg total) under the tongue every 5 (five) minutes as needed for chest pain. 25 tablet 3  . ondansetron (ZOFRAN) 8 MG tablet Take 8 mg by mouth every 8 (eight) hours as needed for nausea or vomiting.    . pravastatin (PRAVACHOL) 20 MG tablet TAKE 1 TABLET (20 MG TOTAL) BY MOUTH DAILY. 30 tablet 6   No current facility-administered medications for this visit.     Allergies: No Known Allergies  Social History: The patient  reports that he has never smoked. He has never used smokeless tobacco. He reports that he does not drink alcohol or use drugs.   Family History: The patient's family history includes Cancer in his mother; Coronary artery disease in his brother and father; Healthy in his sister and sister; Heart attack in his maternal grandfather; Heart attack (age of onset: 66) in his brother; Heart attack (age of onset: 30) in his father; Heart disease in his other and paternal grandmother; Stroke in his maternal grandmother.   Review of Systems: Please see the history of present illness.   Otherwise, the review of systems is positive for none.   All other systems are reviewed and negative.   Physical Exam: VS:  BP 122/78   Pulse 65   Ht  (1.676 m)   Wt 161 lb (73 kg)   BMI 25.99 kg/m  .  BMI Body mass index is 25.99 kg/m.  Wt Readings from Last 3 Encounters:  07/28/16 161 lb (73 kg)  03/01/16 156 lb (70.8 kg)  02/23/16 151 lb 14.4 oz (68.9 kg)    General: Pleasant. Well developed, well nourished and in no acute distress.   HEENT: Normal.  Neck: Supple, no JVD, carotid bruits, or masses noted.  Cardiac: Regular rate and rhythm. No murmurs, rubs, or gallops. No edema.  Respiratory:  Lungs are clear to auscultation bilaterally with normal work of breathing.  GI: Soft and nontender.  MS: No deformity or atrophy. Gait and ROM intact.   Skin: Warm and dry. Color is normal.  Neuro:  Strength and sensation are intact and no gross focal deficits noted.  Psych: Alert, appropriate and with normal affect.   LABORATORY DATA:  EKG:  EKG is ordered today. This shows NSR and is unchanged.  Lab Results  Component Value Date   WBC 9.7 02/23/2016   HGB 13.5 02/23/2016   HCT 40.4 02/23/2016   PLT 193 02/23/2016   GLUCOSE 115 (H)  02/23/2016   CHOL 135 02/23/2016   TRIG 82 02/23/2016   HDL 40 (L) 02/23/2016   LDLCALC 79 02/23/2016   ALT 45 03/28/2011   AST 23 03/28/2011   NA 138 02/23/2016   K 3.7 02/23/2016   CL 106 02/23/2016   CREATININE 0.70 02/23/2016   BUN 18 02/23/2016   CO2 22 02/23/2016   TSH 0.492 03/28/2011   INR 1.04 03/28/2011   HGBA1C  03/28/2011    4.9 (NOTE)                                                                       According to the ADA Clinical Practice Recommendations for 2011, when HbA1c is used as a screening test:   >=6.5%   Diagnostic of Diabetes Mellitus           (if abnormal result  is confirmed)  5.7-6.4%   Increased risk of developing Diabetes Mellitus  References:Diagnosis and Classification of Diabetes Mellitus,Diabetes Care,2011,34(Suppl 1):S62-S69 and Standards of Medical Care in         Diabetes - 2011,Diabetes Care,2011,34  (Suppl 1):S11-S61.    BNP (last 3 results) No results for input(s): BNP in the last 8760 hours.  ProBNP (last 3 results) No results for input(s): PROBNP in the last 8760 hours.   Other Studies Reviewed Today:  Echo Study Conclusions from 01/2016  - Left ventricle: The cavity size was normal. Wall thickness was   increased in a pattern of mild LVH. Systolic function was normal.   The estimated ejection fraction was in the range of 60% to 65%.   Although no diagnostic regional wall motion abnormality was   identified, this possibility cannot be completely excluded on the   basis of this study. Features are consistent with a pseudonormal   left  ventricular filling pattern, with concomitant abnormal   relaxation and increased filling pressure (grade 2 diastolic   dysfunction). - Aortic valve: There was no stenosis. - Mitral valve: There was trivial regurgitation. - Right ventricle: The cavity size was normal. Systolic function   was normal. - Pulmonary arteries: No complete TR doppler jet so unable to   estimate PA systolic pressure. - Inferior vena cava: The vessel was normal in size. The   respirophasic diameter changes were in the normal range (>= 50%),   consistent with normal central venous pressure. - Pericardium, extracardiac: A trivial pericardial effusion was   identified.  Impressions:  - Normal LV size with mild LV hypertrophy. Moderate diastolic   dysfunction. EF 60-65%. Normal RV size and systolic function. No   significant valvular abnormalities.  MYOVIEW FINDINGS FROM 02/2016: Perfusion: No decreased activity in the left ventricle on stress imaging to suggest reversible ischemia or infarction.  Wall Motion: Normal left ventricular wall motion. No left ventricular dilation.  Left Ventricular Ejection Fraction: 69 %  End diastolic volume 83 ml  End systolic volume 26 ml  IMPRESSION: 1. No reversible ischemia or infarction.  2. Normal left ventricular wall motion.  3. Left ventricular ejection fraction 69%  4. Low-risk stress test findings*.  *2012 Appropriate Use Criteria for Coronary Revascularization Focused Update: J Am Coll Cardiol. 2012;59(9):857-881. http://content.dementiazones.com.aspx?articleid=1201161   Electronically Signed   By: Roselie Awkward.D.  On: 02/23/2016 11:28   Assessment/Plan: 1. Chest pain - persisting. Has known CAD - referring on for cardiac catheterization.   2. CAD - last cath in 2014. Now with recurrent chest pain. Increasing Imdur to 90 mg a day. Continue DAPT. Cardiac cath arranged for Monday with Dr. Katrinka Blazing. The patient understands that  risks include but are not limited to stroke (1 in 1000), death (1 in 1000), kidney failure [usually temporary] (1 in 500), bleeding (1 in 200), allergic reaction [possibly serious] (1 in 200), and agrees to proceed.   3. HTN - BP ok on current regimen.  4. HLD - on statin therapy.   Current medicines are reviewed with the patient today.  The patient does not have concerns regarding medicines other than what has been noted above.  The following changes have been made:  See above.  Labs/ tests ordered today include:    Orders Placed This Encounter  Procedures  . Basic metabolic panel  . CBC  . Protime-INR  . APTT  . EKG 12-Lead     Disposition:   Further disposition pending.   Patient is agreeable to this plan and will call if any problems develop in the interim.   Signed: Rosalio Macadamia, RN, ANP-C 07/28/2016 11:32 AM  [facility] [address] [place], [place]  [address] Phone: [phone] Fax: [phone]

## 2016-07-31 ENCOUNTER — Encounter (HOSPITAL_COMMUNITY)
Admission: RE | Disposition: A | Discharge status: Home / Self Care | Payer: Self-pay | Source: Ambulatory Visit | Attending: Interventional Cardiology

## 2016-07-31 ENCOUNTER — Ambulatory Visit (HOSPITAL_COMMUNITY)
Admission: RE | Admit: 2016-07-31 | Discharge: 2016-07-31 | Disposition: A | Discharge status: Home / Self Care | Payer: 59 | Source: Ambulatory Visit | Attending: Interventional Cardiology | Admitting: Interventional Cardiology

## 2016-07-31 ENCOUNTER — Encounter (HOSPITAL_COMMUNITY): Payer: Self-pay | Admitting: Interventional Cardiology

## 2016-07-31 DIAGNOSIS — G4733 Obstructive sleep apnea (adult) (pediatric): Secondary | ICD-10-CM | POA: Insufficient documentation

## 2016-07-31 DIAGNOSIS — Z955 Presence of coronary angioplasty implant and graft: Secondary | ICD-10-CM | POA: Diagnosis not present

## 2016-07-31 DIAGNOSIS — I252 Old myocardial infarction: Secondary | ICD-10-CM | POA: Insufficient documentation

## 2016-07-31 DIAGNOSIS — Z8249 Family history of ischemic heart disease and other diseases of the circulatory system: Secondary | ICD-10-CM | POA: Diagnosis not present

## 2016-07-31 DIAGNOSIS — G43909 Migraine, unspecified, not intractable, without status migrainosus: Secondary | ICD-10-CM | POA: Diagnosis not present

## 2016-07-31 DIAGNOSIS — I2510 Atherosclerotic heart disease of native coronary artery without angina pectoris: Secondary | ICD-10-CM | POA: Insufficient documentation

## 2016-07-31 DIAGNOSIS — N529 Male erectile dysfunction, unspecified: Secondary | ICD-10-CM | POA: Insufficient documentation

## 2016-07-31 DIAGNOSIS — Z9989 Dependence on other enabling machines and devices: Secondary | ICD-10-CM | POA: Diagnosis present

## 2016-07-31 DIAGNOSIS — E785 Hyperlipidemia, unspecified: Secondary | ICD-10-CM | POA: Diagnosis not present

## 2016-07-31 DIAGNOSIS — Z7982 Long term (current) use of aspirin: Secondary | ICD-10-CM | POA: Diagnosis not present

## 2016-07-31 DIAGNOSIS — K219 Gastro-esophageal reflux disease without esophagitis: Secondary | ICD-10-CM | POA: Diagnosis not present

## 2016-07-31 DIAGNOSIS — Z823 Family history of stroke: Secondary | ICD-10-CM | POA: Insufficient documentation

## 2016-07-31 DIAGNOSIS — Z7902 Long term (current) use of antithrombotics/antiplatelets: Secondary | ICD-10-CM | POA: Diagnosis not present

## 2016-07-31 DIAGNOSIS — I10 Essential (primary) hypertension: Secondary | ICD-10-CM | POA: Diagnosis present

## 2016-07-31 DIAGNOSIS — Z9861 Coronary angioplasty status: Secondary | ICD-10-CM

## 2016-07-31 HISTORY — PX: CARDIAC CATHETERIZATION: SHX172

## 2016-07-31 SURGERY — LEFT HEART CATH AND CORONARY ANGIOGRAPHY
Anesthesia: LOCAL

## 2016-07-31 MED ORDER — ACETAMINOPHEN 325 MG PO TABS: 650.0000 mg | ORAL_TABLET | ORAL | Status: DC | PRN

## 2016-07-31 MED ORDER — DIAZEPAM 5 MG PO TABS: 10.0000 mg | ORAL_TABLET | ORAL | Status: DC

## 2016-07-31 MED ORDER — MIDAZOLAM HCL 2 MG/2ML IJ SOLN
INTRAMUSCULAR | Status: DC | PRN
  Administered 2016-07-31 (×2): 1 mg via INTRAVENOUS

## 2016-07-31 MED ORDER — VERAPAMIL HCL 2.5 MG/ML IV SOLN
INTRAVENOUS | Status: DC | PRN
  Administered 2016-07-31: 10 mL via INTRA_ARTERIAL

## 2016-07-31 MED ORDER — SODIUM CHLORIDE 0.9% FLUSH: 3.0000 mL | Freq: Two times a day (BID) | INTRAVENOUS | Status: DC

## 2016-07-31 MED ORDER — HEPARIN (PORCINE) IN NACL 2-0.9 UNIT/ML-% IJ SOLN
INTRAMUSCULAR | Status: AC
  Filled 2016-07-31: qty 1000

## 2016-07-31 MED ORDER — IOPAMIDOL (ISOVUE-370) INJECTION 76%
INTRAVENOUS | Status: AC
  Filled 2016-07-31: qty 100

## 2016-07-31 MED ORDER — SODIUM CHLORIDE 0.9% FLUSH: 3.0000 mL | INTRAVENOUS | Status: DC | PRN

## 2016-07-31 MED ORDER — LIDOCAINE HCL (PF) 1 % IJ SOLN
INTRAMUSCULAR | Status: DC | PRN
  Administered 2016-07-31: 2 mL

## 2016-07-31 MED ORDER — OXYCODONE-ACETAMINOPHEN 5-325 MG PO TABS: 1.0000 | ORAL_TABLET | ORAL | Status: DC | PRN

## 2016-07-31 MED ORDER — ASPIRIN 81 MG PO CHEW
CHEWABLE_TABLET | ORAL | Status: AC
  Filled 2016-07-31: qty 1

## 2016-07-31 MED ORDER — IOPAMIDOL (ISOVUE-370) INJECTION 76%
INTRAVENOUS | Status: DC | PRN
  Administered 2016-07-31: 50 mL via INTRA_ARTERIAL

## 2016-07-31 MED ORDER — ASPIRIN 81 MG PO CHEW: 81.0000 mg | CHEWABLE_TABLET | ORAL | Status: DC

## 2016-07-31 MED ORDER — ONDANSETRON HCL 4 MG/2ML IJ SOLN: 4.0000 mg | Freq: Four times a day (QID) | INTRAMUSCULAR | Status: DC | PRN

## 2016-07-31 MED ORDER — HEPARIN (PORCINE) IN NACL 2-0.9 UNIT/ML-% IJ SOLN
INTRAMUSCULAR | Status: DC | PRN
  Administered 2016-07-31: 1000 mL

## 2016-07-31 MED ORDER — VERAPAMIL HCL 2.5 MG/ML IV SOLN
INTRAVENOUS | Status: AC
  Filled 2016-07-31: qty 2

## 2016-07-31 MED ORDER — FENTANYL CITRATE (PF) 100 MCG/2ML IJ SOLN
INTRAMUSCULAR | Status: DC | PRN
  Administered 2016-07-31: 50 ug via INTRAVENOUS

## 2016-07-31 MED ORDER — DIAZEPAM 5 MG PO TABS
ORAL_TABLET | ORAL | Status: AC
  Administered 2016-07-31: 10 mg
  Filled 2016-07-31: qty 2

## 2016-07-31 MED ORDER — SODIUM CHLORIDE 0.9 % WEIGHT BASED INFUSION: 3.0000 mL/kg/h | INTRAVENOUS | Status: AC

## 2016-07-31 MED ORDER — FENTANYL CITRATE (PF) 100 MCG/2ML IJ SOLN
INTRAMUSCULAR | Status: AC
  Filled 2016-07-31: qty 2

## 2016-07-31 MED ORDER — HEPARIN SODIUM (PORCINE) 1000 UNIT/ML IJ SOLN
INTRAMUSCULAR | Status: DC | PRN
  Administered 2016-07-31: 3500 [IU] via INTRAVENOUS

## 2016-07-31 MED ORDER — SODIUM CHLORIDE 0.9 % IV SOLN: 250.0000 mL | INTRAVENOUS | Status: DC | PRN

## 2016-07-31 MED ORDER — SODIUM CHLORIDE 0.9 % IV SOLN
INTRAVENOUS | Status: DC
  Administered 2016-07-31: 06:00:00 via INTRAVENOUS

## 2016-07-31 MED ORDER — HEPARIN SODIUM (PORCINE) 1000 UNIT/ML IJ SOLN
INTRAMUSCULAR | Status: AC
  Filled 2016-07-31: qty 1

## 2016-07-31 MED ORDER — MIDAZOLAM HCL 2 MG/2ML IJ SOLN
INTRAMUSCULAR | Status: AC
  Filled 2016-07-31: qty 2

## 2016-07-31 MED ORDER — LIDOCAINE HCL (PF) 1 % IJ SOLN
INTRAMUSCULAR | Status: AC
  Filled 2016-07-31: qty 30

## 2016-07-31 SURGICAL SUPPLY — 9 items
CATH INFINITI 5 FR JL3.5 (CATHETERS) ×2 IMPLANT
CATH INFINITI JR4 5F (CATHETERS) ×2 IMPLANT
DEVICE RAD COMP TR BAND LRG (VASCULAR PRODUCTS) ×2 IMPLANT
GLIDESHEATH SLEND A-KIT 6F 22G (SHEATH) ×2 IMPLANT
KIT HEART LEFT (KITS) ×2 IMPLANT
PACK CARDIAC CATHETERIZATION (CUSTOM PROCEDURE TRAY) ×2 IMPLANT
TRANSDUCER W/STOPCOCK (MISCELLANEOUS) ×2 IMPLANT
TUBING CIL FLEX 10 FLL-RA (TUBING) ×2 IMPLANT
WIRE SAFE-T 1.5MM-J .035X260CM (WIRE) ×2 IMPLANT

## 2016-07-31 NOTE — Interval H&P Note (Signed)
Cath Lab Visit (complete for each Cath Lab visit)  Clinical Evaluation Leading to the Procedure:   ACS: Yes.    Non-ACS:    Anginal Classification: CCS III  Anti-ischemic medical therapy: Maximal Therapy (2 or more classes of medications)  Non-Invasive Test Results: Low-risk stress test findings: cardiac mortality <1%/year  Prior CABG: No previous CABG      History and Physical Interval Note:  07/31/2016 7:10 AM  Declin L Krieger  has presented today for surgery, with the diagnosis of chest pain  The various methods of treatment have been discussed with the patient and family. After consideration of risks, benefits and other options for treatment, the patient has consented to  Procedure(s): Left Heart Cath and Coronary Angiography (N/A) as a surgical intervention .  The patient's history has been reviewed, patient examined, no change in status, stable for surgery.  I have reviewed the patient's chart and labs.  Questions were answered to the patient's satisfaction.     Lyn RecordsHenry W Cordel Drewes III

## 2016-07-31 NOTE — Discharge Instructions (Signed)
Radial Site Care °Refer to this sheet in the next few weeks. These instructions provide you with information about caring for yourself after your procedure. Your health care provider may also give you more specific instructions. Your treatment has been planned according to current medical practices, but problems sometimes occur. Call your health care provider if you have any problems or questions after your procedure. °WHAT TO EXPECT AFTER THE PROCEDURE °After your procedure, it is typical to have the following: °· Bruising at the radial site that usually fades within 1-2 weeks. °· Blood collecting in the tissue (hematoma) that may be painful to the touch. It should usually decrease in size and tenderness within 1-2 weeks. °HOME CARE INSTRUCTIONS °· Take medicines only as directed by your health care provider. °· You may shower 24-48 hours after the procedure or as directed by your health care provider. Remove the bandage (dressing) and gently wash the site with plain soap and water. Pat the area dry with a clean towel. Do not rub the site, because this may cause bleeding. °· Do not take baths, swim, or use a hot tub until your health care provider approves. °· Check your insertion site every day for redness, swelling, or drainage. °· Do not apply powder or lotion to the site. °· Do not flex or bend the affected arm for 24 hours or as directed by your health care provider. °· Do not push or pull heavy objects with the affected arm for 24 hours or as directed by your health care provider. °· Do not lift over 10 lb (4.5 kg) for 5 days after your procedure or as directed by your health care provider. °· Ask your health care provider when it is okay to: °¨ Return to work or school. °¨ Resume usual physical activities or sports. °¨ Resume sexual activity. °· Do not drive home if you are discharged the same day as the procedure. Have someone else drive you. °· You may drive 24 hours after the procedure unless otherwise  instructed by your health care provider. °· Do not operate machinery or power tools for 24 hours after the procedure. °· If your procedure was done as an outpatient procedure, which means that you went home the same day as your procedure, a responsible adult should be with you for the first 24 hours after you arrive home. °· Keep all follow-up visits as directed by your health care provider. This is important. °SEEK MEDICAL CARE IF: °· You have a fever. °· You have chills. °· You have increased bleeding from the radial site. Hold pressure on the site. °SEEK IMMEDIATE MEDICAL CARE IF: °· You have unusual pain at the radial site. °· You have redness, warmth, or swelling at the radial site. °· You have drainage (other than a small amount of blood on the dressing) from the radial site. °· The radial site is bleeding, and the bleeding does not stop after 30 minutes of holding steady pressure on the site. °· Your arm or hand becomes pale, cool, tingly, or numb. °  °This information is not intended to replace advice given to you by your health care provider. Make sure you discuss any questions you have with your health care provider. °  °Document Released: 01/13/2011 Document Revised: 01/01/2015 Document Reviewed: 06/29/2014 °Elsevier Interactive Patient Education ©2016 [employer] ° °

## 2016-07-31 NOTE — CV Procedure (Signed)
   Widely patent distal RCA/PDA and mid circumflex stents. No significant disease in the LAD. No change from prior angiogram.

## 2016-07-31 NOTE — H&P (View-Only) (Signed)
CARDIOLOGY OFFICE NOTE  Date:  07/28/2016    Kyle Blalockurley L Denne Date of Birth: 09/01/1966 Medical Record #[id number]  PCP:  Lucila MaineSCOTT, ROBERT, MD  Cardiologist:  Allyson SabalBerry  Chief Complaint  Patient presents with  . Chest Pain    [facility] - seen for Dr. Allyson SabalBerry    History of Present Illness: Kyle BlalockCurley L Chavez is a 50 y.o. male who presents today for a post ER visit from [facility].   He has a history of CAD, Kyle, HLD, OSA and HTN.  He has had prior 2 vessel PCI and stenting via right radial approach July 2010 of the circumflex and right coronary artery. He did have moderately diffuse disease of his ramus branch which was 70% and treated medically.  [facility] and he was told this was ok.   He was admitted to the hospital with chest pain 02/22/16 through 02/23/16. He ruled out for myocardial infarction. 2-D echo was normal. A Myoview stress test showed no ischemia. His enzymes were negative and he was discharged home.   Last seen back in March following that admission.   Comes in today. Here with his wife. [facility]. Has had what he says was right sided chest pain - but rubbing his left side chest and tells me it has been all on the left side. Started 2 days ago. Taking NTG - did this on Wednesday Am - some relief. Not much done there. Sounds like his labs were ok and he ruled out for MI. His symptoms have persisted. Worse with exertion. Very similar to prior chest pain syndrome. No current symptoms at time of exam. No more NTG used. He is very inactive.   Past Medical History:  Diagnosis Date  . Angina   . CAD (coronary artery disease)   . Dyslipidemia   . Kyle (erectile dysfunction)   . Family history of heart disease   . GERD (gastroesophageal reflux disease)   . History of nuclear stress test 04/18/2011   exercise myoview; normal pattern of perfusion in all regions; low risk scan   . Hypertension   . Migraine 03/05/12   "still  have them every now and then"  . Myocardial infarction (HCC) 2010  . OSA on CPAP     Past Surgical History:  Procedure Laterality Date  . CARDIAC CATHETERIZATION  03/05/12   patent stents, diffusely disease ramus branch  . CHOLECYSTECTOMY  ?1990's  . CORONARY ANGIOPLASTY WITH STENT PLACEMENT  07/09/2009   PCI & stenting with Xience DES of circumflex & RCA  . LACERATION REPAIR  ~ 2009   right thumb  . LEFT HEART CATHETERIZATION WITH CORONARY ANGIOGRAM N/A 03/05/2012   Procedure: LEFT HEART CATHETERIZATION WITH CORONARY ANGIOGRAM;  Surgeon: Runell GessJonathan J Berry, MD;  [facility];  Service: Cardiovascular;  Laterality: N/A;  . Rt arm surgery  1960's   "caught in washing machine; have had 2 skin grafts there"  . TONSILLECTOMY AND ADENOIDECTOMY     "when I was a kid"     Medications: Current Outpatient Prescriptions  Medication Sig Dispense Refill  . aspirin 81 MG chewable tablet Chew 81 mg by mouth daily.    . carvedilol (COREG) 12.5 MG tablet TAKE 1 TABLET BY MOUTH 2 TIMES DAILY WITH A MEAL. 60 tablet 11  . clopidogrel (PLAVIX) 75 MG tablet Take 1 tablet (75 mg total) by mouth daily. 30 tablet 11  .  famotidine (PEPCID) 40 MG tablet Take 1 tablet (40 mg total) by mouth daily. 30 tablet 9  . fexofenadine (ALLEGRA) 180 MG tablet Take 180 mg by mouth daily.    Marland Kitchen gabapentin (NEURONTIN) 100 MG capsule Take 200 mg by mouth at bedtime.  2  . isosorbide mononitrate (IMDUR) 60 MG 24 hr tablet TAKE 1 1/2 TABLET (90 MG TOTAL) BY MOUTH DAILY. 30 tablet 6  . ketorolac (TORADOL) 10 MG tablet Take 10 mg by mouth every 6 (six) hours as needed for moderate pain (headache).    . losartan (COZAAR) 50 MG tablet Take 1 tablet (50 mg total) by mouth daily. 30 tablet 11  . Multiple Vitamin (MULTIVITAMIN) tablet Take 1 tablet by mouth daily.    Marland Kitchen NIFEdipine (PROCARDIA-XL/ADALAT-CC/NIFEDICAL-XL) 30 MG 24 hr tablet TAKE 1 TABLET (30 MG TOTAL) BY MOUTH DAILY. MUST MAKE APPOINTMENT FOR FUTURE REFILLS (Patient  taking differently: TAKE 1 TABLET (30 MG TOTAL) BY MOUTH DAILY.) 30 tablet 11  . nitroGLYCERIN (NITROSTAT) 0.4 MG SL tablet Place 1 tablet (0.4 mg total) under the tongue every 5 (five) minutes as needed for chest pain. 25 tablet 3  . ondansetron (ZOFRAN) 8 MG tablet Take 8 mg by mouth every 8 (eight) hours as needed for nausea or vomiting.    . pravastatin (PRAVACHOL) 20 MG tablet TAKE 1 TABLET (20 MG TOTAL) BY MOUTH DAILY. 30 tablet 6   No current facility-administered medications for this visit.     Allergies: No Known Allergies  Social History: The patient  reports that he has never smoked. He has never used smokeless tobacco. He reports that he does not drink alcohol or use drugs.   Family History: The patient's family history includes Cancer in his mother; Coronary artery disease in his brother and father; Healthy in his sister and sister; Heart attack in his maternal grandfather; Heart attack (age of onset: 66) in his brother; Heart attack (age of onset: 30) in his father; Heart disease in his other and paternal grandmother; Stroke in his maternal grandmother.   Review of Systems: Please see the history of present illness.   Otherwise, the review of systems is positive for none.   All other systems are reviewed and negative.   Physical Exam: VS:  BP 122/78   Pulse 65   Ht  (1.676 m)   Wt 161 lb (73 kg)   BMI 25.99 kg/m  .  BMI Body mass index is 25.99 kg/m.  Wt Readings from Last 3 Encounters:  07/28/16 161 lb (73 kg)  03/01/16 156 lb (70.8 kg)  02/23/16 151 lb 14.4 oz (68.9 kg)    General: Pleasant. Well developed, well nourished and in no acute distress.   HEENT: Normal.  Neck: Supple, no JVD, carotid bruits, or masses noted.  Cardiac: Regular rate and rhythm. No murmurs, rubs, or gallops. No edema.  Respiratory:  Lungs are clear to auscultation bilaterally with normal work of breathing.  GI: Soft and nontender.  MS: No deformity or atrophy. Gait and ROM intact.   Skin: Warm and dry. Color is normal.  Neuro:  Strength and sensation are intact and no gross focal deficits noted.  Psych: Alert, appropriate and with normal affect.   LABORATORY DATA:  EKG:  EKG is ordered today. This shows NSR and is unchanged.  Lab Results  Component Value Date   WBC 9.7 02/23/2016   HGB 13.5 02/23/2016   HCT 40.4 02/23/2016   PLT 193 02/23/2016   GLUCOSE 115 (H)  02/23/2016   CHOL 135 02/23/2016   TRIG 82 02/23/2016   HDL 40 (L) 02/23/2016   LDLCALC 79 02/23/2016   ALT 45 03/28/2011   AST 23 03/28/2011   NA 138 02/23/2016   K 3.7 02/23/2016   CL 106 02/23/2016   CREATININE 0.70 02/23/2016   BUN 18 02/23/2016   CO2 22 02/23/2016   TSH 0.492 03/28/2011   INR 1.04 03/28/2011   HGBA1C  03/28/2011    4.9 (NOTE)                                                                       According to the ADA Clinical Practice Recommendations for 2011, when HbA1c is used as a screening test:   >=6.5%   Diagnostic of Diabetes Mellitus           (if abnormal result  is confirmed)  5.7-6.4%   Increased risk of developing Diabetes Mellitus  References:Diagnosis and Classification of Diabetes Mellitus,Diabetes Care,2011,34(Suppl 1):S62-S69 and Standards of Medical Care in         Diabetes - 2011,Diabetes Care,2011,34  (Suppl 1):S11-S61.    BNP (last 3 results) No results for input(s): BNP in the last 8760 hours.  ProBNP (last 3 results) No results for input(s): PROBNP in the last 8760 hours.   Other Studies Reviewed Today:  Echo Study Conclusions from 01/2016  - Left ventricle: The cavity size was normal. Wall thickness was   increased in a pattern of mild LVH. Systolic function was normal.   The estimated ejection fraction was in the range of 60% to 65%.   Although no diagnostic regional wall motion abnormality was   identified, this possibility cannot be completely excluded on the   basis of this study. Features are consistent with a pseudonormal   left  ventricular filling pattern, with concomitant abnormal   relaxation and increased filling pressure (grade 2 diastolic   dysfunction). - Aortic valve: There was no stenosis. - Mitral valve: There was trivial regurgitation. - Right ventricle: The cavity size was normal. Systolic function   was normal. - Pulmonary arteries: No complete TR doppler jet so unable to   estimate PA systolic pressure. - Inferior vena cava: The vessel was normal in size. The   respirophasic diameter changes were in the normal range (>= 50%),   consistent with normal central venous pressure. - Pericardium, extracardiac: A trivial pericardial effusion was   identified.  Impressions:  - Normal LV size with mild LV hypertrophy. Moderate diastolic   dysfunction. EF 60-65%. Normal RV size and systolic function. No   significant valvular abnormalities.  MYOVIEW FINDINGS FROM 02/2016: Perfusion: No decreased activity in the left ventricle on stress imaging to suggest reversible ischemia or infarction.  Wall Motion: Normal left ventricular wall motion. No left ventricular dilation.  Left Ventricular Ejection Fraction: 69 %  End diastolic volume 83 ml  End systolic volume 26 ml  IMPRESSION: 1. No reversible ischemia or infarction.  2. Normal left ventricular wall motion.  3. Left ventricular ejection fraction 69%  4. Low-risk stress test findings*.  *2012 Appropriate Use Criteria for Coronary Revascularization Focused Update: J Am Coll Cardiol. 2012;59(9):857-881. http://content.dementiazones.com.aspx?articleid=1201161   Electronically Signed   By: Roselie Awkward.D.  On: 02/23/2016 11:28   Assessment/Plan: 1. Chest pain - persisting. Has known CAD - referring on for cardiac catheterization.   2. CAD - last cath in 2014. Now with recurrent chest pain. Increasing Imdur to 90 mg a day. Continue DAPT. Cardiac cath arranged for Monday with Dr. Katrinka Blazing. The patient understands that  risks include but are not limited to stroke (1 in 1000), death (1 in 1000), kidney failure [usually temporary] (1 in 500), bleeding (1 in 200), allergic reaction [possibly serious] (1 in 200), and agrees to proceed.   3. HTN - BP ok on current regimen.  4. HLD - on statin therapy.   Current medicines are reviewed with the patient today.  The patient does not have concerns regarding medicines other than what has been noted above.  The following changes have been made:  See above.  Labs/ tests ordered today include:    Orders Placed This Encounter  Procedures  . Basic metabolic panel  . CBC  . Protime-INR  . APTT  . EKG 12-Lead     Disposition:   Further disposition pending.   Patient is agreeable to this plan and will call if any problems develop in the interim.   Signed: Rosalio Macadamia, RN, ANP-C 07/28/2016 11:32 AM  [facility] [address] [place], [place]  [address] Phone: [phone] Fax: [phone]

## 2016-09-01 ENCOUNTER — Encounter: Payer: Self-pay | Admitting: Cardiology

## 2016-09-01 ENCOUNTER — Ambulatory Visit (INDEPENDENT_AMBULATORY_CARE_PROVIDER_SITE_OTHER): Payer: 59 | Admitting: Cardiology

## 2016-09-01 DIAGNOSIS — I2510 Atherosclerotic heart disease of native coronary artery without angina pectoris: Secondary | ICD-10-CM

## 2016-09-01 DIAGNOSIS — Z9989 Dependence on other enabling machines and devices: Secondary | ICD-10-CM

## 2016-09-01 DIAGNOSIS — Z9861 Coronary angioplasty status: Secondary | ICD-10-CM

## 2016-09-01 DIAGNOSIS — G4733 Obstructive sleep apnea (adult) (pediatric): Secondary | ICD-10-CM

## 2016-09-01 DIAGNOSIS — I10 Essential (primary) hypertension: Secondary | ICD-10-CM

## 2016-09-01 DIAGNOSIS — R072 Precordial pain: Secondary | ICD-10-CM

## 2016-09-01 DIAGNOSIS — E785 Hyperlipidemia, unspecified: Secondary | ICD-10-CM | POA: Diagnosis not present

## 2016-09-01 NOTE — Assessment & Plan Note (Signed)
Controlled.  

## 2016-09-01 NOTE — Assessment & Plan Note (Signed)
LDL 70 March 2017

## 2016-09-01 NOTE — Progress Notes (Signed)
09/01/2016 Kyle Chavez L Chavez   1966/08/23  [id number]  Primary Physician Lucila MaineSCOTT, ROBERT, MD Primary Cardiologist: Dr Allyson Sabalberry  HPI:  50 y.o. Caucasian male with a history of CAD, Kyle, HLD, OSA and HTN.  He has CFX and RCA l PCI and stenting via right radial approach July 2010. He did have moderately diffuse disease of his ramus branch which was 70% and treated medically. His [facility] and he was told this was ok. He was admitted to the hospital with chest pain 02/22/16 through 02/23/16. He ruled out for myocardial infarction. 2-D echo showed normal LVF with grade 2 DD. A Myoview stress test showed no ischemia. His enzymes were negative and he was discharged home.  He presented to the office in 07/28/16 again with chest pain and was admitted for elective cath 08/01/16. This showed patent stents, 50% RI, 85% Dx1. It was essentially unchanged. Plan is for medical Rx. He is in the office today for follow up. He still has Lt sided chest pain that radiates to his Lt arm. It's described as "pressure" but lasts less than a minute. There may be some relation to movement but not activity. He has this several times a week. No associated cough, hemoptysis, or SOB.   Current Outpatient Prescriptions  Medication Sig Dispense Refill  . aspirin 81 MG chewable tablet Chew 81 mg by mouth daily.    . carvedilol (COREG) 12.5 MG tablet TAKE 1 TABLET BY MOUTH 2 TIMES DAILY WITH A MEAL. 60 tablet 11  . clopidogrel (PLAVIX) 75 MG tablet Take 1 tablet (75 mg total) by mouth daily. 30 tablet 11  . famotidine (PEPCID) 40 MG tablet Take 1 tablet (40 mg total) by mouth daily. 30 tablet 9  . fexofenadine (ALLEGRA) 180 MG tablet Take 180 mg by mouth daily.    Marland Kitchen. gabapentin (NEURONTIN) 100 MG capsule Take 200 mg by mouth at bedtime.  2  . isosorbide mononitrate (IMDUR) 60 MG 24 hr tablet TAKE 1 1/2 TABLET (90 MG TOTAL) BY MOUTH DAILY. 30 tablet 6  . ketorolac (TORADOL) 10 MG tablet TAKE 1 TABLET 4 TIMES A DAY FOR SEVERE  HEADACHE  0  . losartan (COZAAR) 50 MG tablet Take 1 tablet (50 mg total) by mouth daily. 30 tablet 11  . Multiple Vitamin (MULTIVITAMIN) tablet Take 1 tablet by mouth daily.    Marland Kitchen. NIFEdipine (PROCARDIA-XL/ADALAT-CC/NIFEDICAL-XL) 30 MG 24 hr tablet TAKE 1 TABLET (30 MG TOTAL) BY MOUTH DAILY. MUST MAKE APPOINTMENT FOR FUTURE REFILLS (Patient taking differently: TAKE 1 TABLET (30 MG TOTAL) BY MOUTH DAILY.) 30 tablet 11  . nitroGLYCERIN (NITROSTAT) 0.4 MG SL tablet Place 1 tablet (0.4 mg total) under the tongue every 5 (five) minutes as needed for chest pain. 25 tablet 3  . ondansetron (ZOFRAN) 8 MG tablet Take 8 mg by mouth every 8 (eight) hours as needed for nausea or vomiting.    . pravastatin (PRAVACHOL) 20 MG tablet TAKE 1 TABLET (20 MG TOTAL) BY MOUTH DAILY. 30 tablet 6   No current facility-administered medications for this visit.     No Known Allergies  Social History   Social History  . Marital status: Married    Spouse name: N/A  . Number of children: 1  . Years of education: N/A   Occupational History  . Truck driver     drives for [employer]  .  [employer]   Social History Main Topics  . Smoking status: Never Smoker  .  Smokeless tobacco: Never Used  . Alcohol use No     Comment: "quit drinking ~ 2010"  . Drug use: No  . Sexual activity: Not Currently   Other Topics Concern  . Not on file   Social History Narrative      Pt lives at home with his spouse.   Caffeine Use- Drinks soda daily     Review of Systems: General: negative for chills, fever, night sweats or weight changes.  Cardiovascular: negative for chest pain, dyspnea on exertion, edema, orthopnea, palpitations, paroxysmal nocturnal dyspnea or shortness of breath Dermatological: negative for rash Respiratory: negative for cough or wheezing Urologic: negative for hematuria Abdominal: negative for nausea, vomiting, diarrhea, bright red blood per rectum, melena, or hematemesis Neurologic: negative for visual  changes, syncope, or dizziness All other systems reviewed and are otherwise negative except as noted above.    Blood pressure 110/60, pulse (!) 56, height 5\' 6"  (1.676 m), weight 162 lb 2 oz (73.5 kg).  General appearance: alert, cooperative and no distress Neck: no carotid bruit and no JVD Lungs: clear to auscultation bilaterally Heart: regular rate and rhythm Extremities: extremities normal, atraumatic, no cyanosis or edema Skin: Skin color, texture, turgor normal. No rashes or lesions Neurologic: Grossly normal    ASSESSMENT AND PLAN:   Chest pain Unclear what this is from but it does not appear to be cardiac in nature  CAD S/P PCI July 2010 RCA and CFX PCI in July 2010 C/P March 2017- Myoview low risk C/P Aug 2017- cath patent stents, 50% RI, 85% Dx1, normal LVF- medical Rx  Essential hypertension Controlled  OSA on CPAP Compliant  Dyslipidemia LDL 70 March 2017   PLAN  F/U 6 months with Dr Mariana Arn PA-C 09/01/2016 8:58 AM

## 2016-09-01 NOTE — Patient Instructions (Signed)
Medication Instructions:  Your physician recommends that you continue on your current medications as directed. Please refer to the Current Medication list given to you today.  Labwork: None   Testing/Procedures: None   Follow-Up: Your physician wants you to follow-up in: 6 months with Dr Berry. You will receive a reminder letter in the mail two months in advance. If you don't receive a letter, please call our office to schedule the follow-up appointment.  Any Other Special Instructions Will Be Listed Below (If Applicable). If you need a refill on your cardiac medications before your next appointment, please call your pharmacy.  

## 2016-09-01 NOTE — Assessment & Plan Note (Signed)
RCA and CFX PCI in July 2010 C/P March 2017- Myoview low risk C/P Aug 2017- cath patent stents, 50% RI, 85% Dx1, normal LVF- medical Rx

## 2016-09-01 NOTE — Assessment & Plan Note (Signed)
Unclear what this is from but it does not appear to be cardiac in nature

## 2016-09-01 NOTE — Assessment & Plan Note (Signed)
Compliant 

## 2016-09-18 ENCOUNTER — Emergency Department (HOSPITAL_COMMUNITY): Payer: 59

## 2016-09-18 ENCOUNTER — Emergency Department (HOSPITAL_COMMUNITY)
Admission: EM | Admit: 2016-09-18 | Discharge: 2016-09-18 | Disposition: A | Discharge status: Home / Self Care | Payer: 59 | Attending: Emergency Medicine | Admitting: Emergency Medicine

## 2016-09-18 ENCOUNTER — Encounter (HOSPITAL_COMMUNITY): Payer: Self-pay | Admitting: Emergency Medicine

## 2016-09-18 DIAGNOSIS — R0789 Other chest pain: Secondary | ICD-10-CM | POA: Diagnosis not present

## 2016-09-18 DIAGNOSIS — I2510 Atherosclerotic heart disease of native coronary artery without angina pectoris: Secondary | ICD-10-CM | POA: Insufficient documentation

## 2016-09-18 DIAGNOSIS — Z7982 Long term (current) use of aspirin: Secondary | ICD-10-CM | POA: Insufficient documentation

## 2016-09-18 DIAGNOSIS — I252 Old myocardial infarction: Secondary | ICD-10-CM | POA: Insufficient documentation

## 2016-09-18 DIAGNOSIS — I10 Essential (primary) hypertension: Secondary | ICD-10-CM | POA: Diagnosis not present

## 2016-09-18 DIAGNOSIS — E785 Hyperlipidemia, unspecified: Secondary | ICD-10-CM | POA: Diagnosis present

## 2016-09-18 DIAGNOSIS — Z955 Presence of coronary angioplasty implant and graft: Secondary | ICD-10-CM | POA: Diagnosis not present

## 2016-09-18 DIAGNOSIS — R7989 Other specified abnormal findings of blood chemistry: Secondary | ICD-10-CM | POA: Diagnosis not present

## 2016-09-18 DIAGNOSIS — R945 Abnormal results of liver function studies: Secondary | ICD-10-CM

## 2016-09-18 DIAGNOSIS — Z9861 Coronary angioplasty status: Secondary | ICD-10-CM

## 2016-09-18 LAB — HEPATIC FUNCTION PANEL
ALK PHOS: 74 U/L (ref 38–126)
ALT: 64 U/L — AB (ref 17–63)
AST: 87 U/L — AB (ref 15–41)
Albumin: 4.1 g/dL (ref 3.5–5.0)
BILIRUBIN DIRECT: 0.4 mg/dL (ref 0.1–0.5)
BILIRUBIN TOTAL: 1.6 mg/dL — AB (ref 0.3–1.2)
Indirect Bilirubin: 1.2 mg/dL — ABNORMAL HIGH (ref 0.3–0.9)
Total Protein: 6.1 g/dL — ABNORMAL LOW (ref 6.5–8.1)

## 2016-09-18 LAB — CBC
HCT: 41.6 % (ref 39.0–52.0)
Hemoglobin: 14.2 g/dL (ref 13.0–17.0)
MCH: 29.2 pg (ref 26.0–34.0)
MCHC: 34.1 g/dL (ref 30.0–36.0)
MCV: 85.6 fL (ref 78.0–100.0)
PLATELETS: 181 10*3/uL (ref 150–400)
RBC: 4.86 MIL/uL (ref 4.22–5.81)
RDW: 14.2 % (ref 11.5–15.5)
WBC: 7.9 10*3/uL (ref 4.0–10.5)

## 2016-09-18 LAB — BASIC METABOLIC PANEL
Anion gap: 7 (ref 5–15)
BUN: 13 mg/dL (ref 6–20)
CO2: 20 mmol/L — ABNORMAL LOW (ref 22–32)
CREATININE: 0.71 mg/dL (ref 0.61–1.24)
Calcium: 8.4 mg/dL — ABNORMAL LOW (ref 8.9–10.3)
Chloride: 110 mmol/L (ref 101–111)
Glucose, Bld: 115 mg/dL — ABNORMAL HIGH (ref 65–99)
Potassium: 3.7 mmol/L (ref 3.5–5.1)
SODIUM: 137 mmol/L (ref 135–145)

## 2016-09-18 LAB — I-STAT TROPONIN, ED
TROPONIN I, POC: 0 ng/mL (ref 0.00–0.08)
Troponin i, poc: 0 ng/mL (ref 0.00–0.08)

## 2016-09-18 LAB — LIPASE, BLOOD: Lipase: 19 U/L (ref 11–51)

## 2016-09-18 MED ORDER — ONDANSETRON HCL 4 MG/2ML IJ SOLN
4.0000 mg | Freq: Once | INTRAMUSCULAR | Status: AC
  Administered 2016-09-18: 4 mg via INTRAVENOUS
  Filled 2016-09-18: qty 2

## 2016-09-18 MED ORDER — ACETAMINOPHEN 325 MG PO TABS
650.0000 mg | ORAL_TABLET | Freq: Once | ORAL | Status: AC
  Administered 2016-09-18: 650 mg via ORAL
  Filled 2016-09-18: qty 2

## 2016-09-18 MED ORDER — MORPHINE SULFATE (PF) 4 MG/ML IV SOLN
4.0000 mg | Freq: Once | INTRAVENOUS | Status: AC
  Administered 2016-09-18: 4 mg via INTRAVENOUS
  Filled 2016-09-18: qty 1

## 2016-09-18 NOTE — ED Provider Notes (Signed)
[facility] DEPT Provider Note   CSN: [id number] Arrival date & time: 09/18/16  [id number]     History   Chief Complaint Chief Complaint  Patient presents with  . Chest Pain    HPI Kyle Chavez is a 50 y.o. male.  HPI Kyle Chavez is a 50 y.o. male with PMH significant for CAD, MI s/p PCI x 2 stents (2010), dyslipidemia, HTN who presents with gradual onset, waxing and waning, left chest pain he describes as pressure and dull that radiates to his left arm and neck.  It began while he was sitting in the chair getting dressed. He states when it comes on it lasts a couple of minutes.  States this feels like prior MI.  He took one nitro, he then received 3 more en route via EMS and 324 mg ASA with some improvement.  He states when he arrived his pain was around a 6 and it is now a 7.  Associated symptoms include SOB, sweats, and nausea.  Denies fever, chills, cough, vomiting, diarrhea, constipation, abdominal pain, or urinary symptoms.  He was otherwise in his normal state of health prior to symptom onset. Pt is on daily ASA, Plavix, Coreg, Imdur, Nifedipine, and NTG SL PRN.  He states has has been compliant.  No hx of PE/DVT.  No recent immobilization or recent surgery.   Cardiologist: Dr. Allyson SabalBerry Last visit 09/01/16, most recent echo shows nl EF 60-65%, myoview stress test w/o ischemia.  Elective LHC 08/01/16 that showed patent stents.  Continues to have radiating left sided chest pain, unclear in nature, but don't think it's cardiac.    Past Medical History:  Diagnosis Date  . Angina   . CAD (coronary artery disease)   . Dyslipidemia   . Kyle (erectile dysfunction)   . Family history of heart disease   . GERD (gastroesophageal reflux disease)   . History of nuclear stress test 04/18/2011   exercise myoview; normal pattern of perfusion in all regions; low risk scan   . Hypertension   . Migraine 03/05/12   "still have them every now and then"  . Myocardial infarction (HCC) 2010  . OSA  on CPAP     Patient Active Problem List   Diagnosis Date Noted  . Chest wall pain 02/22/2016  . Migraines 09/10/2013  . Night sweats 09/10/2013  . OSA on CPAP   . Unstable angina (HCC) 03/05/2012  . CAD S/P PCI July 2010 03/05/2012  . Essential hypertension 03/05/2012  . Dyslipidemia 03/05/2012  . Family history of coronary artery disease 03/05/2012    Past Surgical History:  Procedure Laterality Date  . CARDIAC CATHETERIZATION  03/05/12   patent stents, diffusely disease ramus branch  . CARDIAC CATHETERIZATION N/A 07/31/2016   Procedure: Left Heart Cath and Coronary Angiography;  Surgeon: Lyn RecordsHenry W Smith, MD;  [facility];  Service: Cardiovascular;  Laterality: N/A;  . CHOLECYSTECTOMY  ?1990's  . CORONARY ANGIOPLASTY WITH STENT PLACEMENT  07/09/2009   PCI & stenting with Xience DES of circumflex & RCA  . LACERATION REPAIR  ~ 2009   right thumb  . LEFT HEART CATHETERIZATION WITH CORONARY ANGIOGRAM N/A 03/05/2012   Procedure: LEFT HEART CATHETERIZATION WITH CORONARY ANGIOGRAM;  Surgeon: Runell GessJonathan J Berry, MD;  [facility];  Service: Cardiovascular;  Laterality: N/A;  . Rt arm surgery  1960's   "caught in washing machine; have had 2 skin grafts there"  . TONSILLECTOMY AND ADENOIDECTOMY     "when I  was a kid"       Home Medications    Prior to Admission medications   Medication Sig Start Date End Date Taking? Authorizing Provider  aspirin 81 MG chewable tablet Chew 81 mg by mouth daily.   Yes Historical Provider, MD  carvedilol (COREG) 12.5 MG tablet TAKE 1 TABLET BY MOUTH 2 TIMES DAILY WITH A MEAL. 03/01/16  Yes Runell Gess, MD  clopidogrel (PLAVIX) 75 MG tablet Take 1 tablet (75 mg total) by mouth daily. 03/01/16  Yes Runell Gess, MD  famotidine (PEPCID) 40 MG tablet Take 1 tablet (40 mg total) by mouth daily. 03/28/16  Yes Runell Gess, MD  fexofenadine (ALLEGRA) 180 MG tablet Take 180 mg by mouth daily.   Yes Historical Provider, MD    gabapentin (NEURONTIN) 100 MG capsule Take 200 mg by mouth at bedtime. 01/10/16  Yes Historical Provider, MD  isosorbide mononitrate (IMDUR) 60 MG 24 hr tablet TAKE 1 1/2 TABLET (90 MG TOTAL) BY MOUTH DAILY. 07/28/16  Yes Rosalio Macadamia, NP  ketorolac (TORADOL) 10 MG tablet TAKE 1 TABLET 4 TIMES A DAY FOR SEVERE HEADACHE 06/28/16  Yes Historical Provider, MD  losartan (COZAAR) 50 MG tablet Take 1 tablet (50 mg total) by mouth daily. 03/01/16  Yes Runell Gess, MD  Multiple Vitamin (MULTIVITAMIN) tablet Take 1 tablet by mouth daily.   Yes Historical Provider, MD  NIFEdipine (PROCARDIA-XL/ADALAT-CC/NIFEDICAL-XL) 30 MG 24 hr tablet TAKE 1 TABLET (30 MG TOTAL) BY MOUTH DAILY. MUST MAKE APPOINTMENT FOR FUTURE REFILLS Patient taking differently: TAKE 1 TABLET (30 MG TOTAL) BY MOUTH DAILY. 03/20/16  Yes Runell Gess, MD  nitroGLYCERIN (NITROSTAT) 0.4 MG SL tablet Place 1 tablet (0.4 mg total) under the tongue every 5 (five) minutes as needed for chest pain. 03/01/16  Yes Runell Gess, MD  ondansetron (ZOFRAN) 8 MG tablet Take 8 mg by mouth every 8 (eight) hours as needed for nausea or vomiting.   Yes Historical Provider, MD  pravastatin (PRAVACHOL) 20 MG tablet TAKE 1 TABLET (20 MG TOTAL) BY MOUTH DAILY. 03/17/16  Yes Runell Gess, MD    Family History Family History  Problem Relation Age of Onset  . Coronary artery disease Father     MI at 51, CABG  . Heart attack Father 8    now has ICD  . Coronary artery disease Brother     MI in 30s  . Heart attack Brother 30  . Cancer Mother   . Healthy Sister   . Healthy Sister   . Heart attack Maternal Grandfather     in 30s  . Heart disease Other     CABG in 40s  . Stroke Maternal Grandmother   . Heart disease Paternal Grandmother     CABGs in 70s    Social History Social History  Substance Use Topics  . Smoking status: Never Smoker  . Smokeless tobacco: Never Used  . Alcohol use No     Comment: "quit drinking ~ 2010"      Allergies   Review of patient's allergies indicates no known allergies.   Review of Systems Review of Systems All other systems negative unless otherwise stated in HPI   Physical Exam Updated Vital Signs BP 130/90   Pulse 73   Temp 97.9 F (36.6 C) (Oral)   Resp 21   Ht 5\' 6"  (1.676 m)   Wt 72.6 kg   SpO2 97%   BMI 25.82 kg/m   Physical Exam  Constitutional: He is oriented to person, place, and time. He appears well-developed and well-nourished.  Non-toxic appearance. He does not have a sickly appearance. He does not appear ill.  HENT:  Head: Normocephalic and atraumatic.  Mouth/Throat: Oropharynx is clear and moist.  Eyes: Conjunctivae are normal. Pupils are equal, round, and reactive to light.  Neck: Normal range of motion. Neck supple.  Cardiovascular: Normal rate and regular rhythm.   No unilateral lower extremity edema.   Pulmonary/Chest: Effort normal and breath sounds normal. No accessory muscle usage or stridor. No respiratory distress. He has no wheezes. He has no rhonchi. He has no rales. He exhibits tenderness.    Abdominal: Soft. Bowel sounds are normal. He exhibits no distension. There is no tenderness.  Musculoskeletal: Normal range of motion.  Lymphadenopathy:    He has no cervical adenopathy.  Neurological: He is alert and oriented to person, place, and time.  Speech clear without dysarthria.  Skin: Skin is warm and dry.  Psychiatric: He has a normal mood and affect. His behavior is normal.     Kyle Treatments / Results  Labs (all labs ordered are listed, but only abnormal results are displayed) Labs Reviewed  BASIC METABOLIC PANEL - Abnormal; Notable for the following:       Result Value   CO2 20 (*)    Glucose, Bld 115 (*)    Calcium 8.4 (*)    All other components within normal limits  HEPATIC FUNCTION PANEL - Abnormal; Notable for the following:    Total Protein 6.1 (*)    AST 87 (*)    ALT 64 (*)    Total Bilirubin 1.6 (*)     Indirect Bilirubin 1.2 (*)    All other components within normal limits  CBC  LIPASE, BLOOD  I-STAT TROPOININ, Kyle  I-STAT TROPOININ, Kyle    EKG  EKG Interpretation  Date/Time:  Monday September 18 2016 07:14:02 EDT Ventricular Rate:  61 PR Interval:    QRS Duration: 92 QT Interval:  378 QTC Calculation: 381 R Axis:   20 Text Interpretation:  Sinus rhythm Normal ECG No significant change since last tracing Confirmed by [facility]  MD, DAVID ([id number]) on 09/18/2016 7:32:10 AM       Radiology Dg Chest 2 View  Result Date: 09/18/2016 CLINICAL DATA:  Chest pain. EXAM: CHEST  2 VIEW COMPARISON:  Radiographs of July 26, 2016. FINDINGS: The heart size and mediastinal contours are within normal limits. Both lungs are clear. No pneumothorax or pleural effusion is noted. The visualized skeletal structures are unremarkable. IMPRESSION: No active cardiopulmonary disease. Electronically Signed   By: Lupita Raider, M.D.   On: 09/18/2016 07:54   US Abdomen Complete  Result Date: 09/18/2016 CLINICAL DATA:  Elevated liver function studies, onset of chest pain at 4 a.m. today, previous cholecystectomy. EXAM: ABDOMEN ULTRASOUND COMPLETE COMPARISON:  Abdominal and pelvic CT scan of August 08, 2011 an abdominal ultrasound of July 01, 2004 FINDINGS: Gallbladder: The gallbladder is surgically absent. Common bile duct: Diameter: 4.7 mm Liver: There is mild intrahepatic ductal dilation. There are 2 right lobe hepatic cysts. The largest measures 2.7 cm in greatest dimension. The hepatic echotexture appears normal. IVC: No abnormality visualized. Pancreas: Bowel gas largely obscures the pancreas. Spleen: Size and appearance within normal limits. Right Kidney: Length: 10.5 cm. The renal cortical echotexture remains slightly lower than that of the adjacent liver. There are probable parapelvic cysts or an extrarenal pelvis. Left Kidney: Length: 11.7 cm. Echogenicity within normal  limits. No mass or hydronephrosis  visualized. Abdominal aorta: The visualized portions of the abdominal aorta are normal. Other findings: There is no ascites. IMPRESSION: 1. Previous cholecystectomy. Mild intrahepatic ductal dilation without common bile duct dilation. Two simple appearing right hepatic lobe cysts. Poor visualization of the pancreas. MRI of the abdomen with attention to the pancreas and liver would be a useful next imaging step. 2. No acute abnormality observed elsewhere within the abdomen. Electronically Signed   By: David  [place] M.D.   On: 09/18/2016 12:38    Procedures Procedures (including critical care time)  Medications Ordered in Kyle Medications  morphine 4 MG/ML injection 4 mg (4 mg Intravenous Given 09/18/16 0838)  ondansetron (ZOFRAN) injection 4 mg (4 mg Intravenous Given 09/18/16 1002)  acetaminophen (TYLENOL) tablet 650 mg (650 mg Oral Given 09/18/16 1120)     Initial Impression / Assessment and Plan / Kyle Course  I have reviewed the triage vital signs and the nursing notes.  Pertinent labs & imaging results that were available during my care of the patient were reviewed by me and considered in my medical decision making (see chart for details).  Clinical Course   Patient presents with chest pain since 4 AM this morning. History of MI with 2 stents. Describes it as a dull pressure that radiates to his left arm and neck. Associated symptoms include shortness of breath and nausea. VSS, NAD. Heart sounds normal, lungs clear to auscultation bilaterally. He does have mild left anterior chest wall tenderness with palpation. Abdomen soft and benign. No unilateral leg swelling. Patient's cardiologist is Dr. Allyson Sabal. LHC August 2017 showed patent stents. Last appointment with cardiology earlier this month, echo with normal EF, stress test without ischemia, and recent cath. Patient received 4 nitroglycerin and aspirin prior to arrival, he states this did help somewhat. EKG without ischemia, normal sinus rhythm.  Chest x-ray normal. Labs without acute abnormalities. Troponin 2 is normal. Consider PE, but low risk Well's criteria.Consider ACS, but atypical presentation, EKG without ischemia, and normal troponin. Given patient's cardiac history, cardiology consulted. Doubt cardiac nature. Patient received morphine, and this made him slightly nauseated. He states this improved with Zofran. He complained somewhat of epigastric discomfort. Repeat abdominal exam benign. However, lipase and hepatic function panel added on. This showed mild elevation in LFTs and Tbili, and ultrasound showed 2 hepatic lobe cysts and poor visualization of pancreas, recommend MRI abdomen.  Discussed these findings with the patient.  Discussed avoiding Tylenol and alcohol.  Follow up PCP.  Return precautions discussed.  Stable for discharge.  Case has been discussed with Dr. Clayborne Dana who agrees with the above plan for discharge.    Final Clinical Impressions(s) / Kyle Diagnoses   Final diagnoses:  Elevated LFTs  Atypical chest pain    New Prescriptions New Prescriptions   No medications on file     Gwinda Maine 09/18/16 [id number]    Marily Memos, MD 09/18/16 1956

## 2016-09-18 NOTE — ED Triage Notes (Signed)
Pt arrived via [facility] from home c/o dull achy chest pain to the left/central chest radiating to the left arm. Pt has a hx of MI in 2010 with 2 stents placed.

## 2016-09-18 NOTE — ED Notes (Signed)
Patient transported to Ultrasound 

## 2016-09-18 NOTE — Discharge Instructions (Signed)
Your symptoms are likely not related to your heart.  Your EKG, chest xray, lab work are normal.  Your liver enzymes are slightly elevated today.  Therefore, we did an ultrasound.  This showed previous cholecystectomy. Mild intrahepatic ductal dilation without common bile duct dilation. Two simple appearing right hepatic lobe cysts. Poor visualization of the pancreas. MRI of the abdomen with attention to the pancreas and liver would be a useful next imaging step.  Please follow up with your primary care doctor who can schedule the outpatient MRI.  Also, avoid Tylenol and alcohol.  Return to the ED if you experience worsening chest pain, abdominal pain, shortness of breath, or any new symptoms.

## 2016-09-18 NOTE — Consult Note (Signed)
CARDIOLOGY CONSULT NOTE   Patient ID: Kyle Chavez MRN: [id number] DOB/AGE: 52-Apr-1967 52 y.o.  Admit date: 09/18/2016  Primary Physician   Lucila Maine, MD Primary Cardiologist   Dr. Allyson Sabal Reason for Consultation  Chest pain Requesting Physician  Dr. Clayborne Dana  HPI: Kyle Chavez is a 50 y.o. male with a history of CAD, HLD, OSA on CPAP, HTN, GERD and ED who brought to ER by EMS for evaluation of chest pain.   Hx of CAD s/p stent to LCx and RCA in 2010. Since then, he has had repeat cardiac catheterization in 2011, 2012, 2014, 2015 at [facility] and Last cath 08/02/16 showed patent stent to RCA and circumflex. 50% RI (improved from 70%), 85% Dx1. Continue medical therapy.   He continues to have atypical left sided intermittent chest pain. This morning he had L sided sharp chest pain when he was getting ready to go work. No SOB, nausea, vomiting or diaphoresis. No radiation. He took one nitro, he then received 3 more en route via EMS and 324 mg ASA with some improvement. He was Morphine in ER and afterwards had severe abdominal spasm with nausea and vomiting. No LE edema, orthopnea and PND.   EKG sinus rhythm at rate of 67 bpm. No acute changes. Troponin negative. CXR clear.   Past Medical History:  Diagnosis Date  . Angina   . CAD (coronary artery disease)   . Dyslipidemia   . ED (erectile dysfunction)   . Family history of heart disease   . GERD (gastroesophageal reflux disease)   . History of nuclear stress test 04/18/2011   exercise myoview; normal pattern of perfusion in all regions; low risk scan   . Hypertension   . Migraine 03/05/12   "still have them every now and then"  . Myocardial infarction (HCC) 2010  . OSA on CPAP      Past Surgical History:  Procedure Laterality Date  . CARDIAC CATHETERIZATION  03/05/12   patent stents, diffusely disease ramus branch  . CARDIAC CATHETERIZATION N/A 07/31/2016   Procedure: Left Heart Cath and Coronary Angiography;  Surgeon:  Lyn Records, MD;  Location: [facility] INVASIVE CV LAB;  Service: Cardiovascular;  Laterality: N/A;  . CHOLECYSTECTOMY  ?1990's  . CORONARY ANGIOPLASTY WITH STENT PLACEMENT  07/09/2009   PCI & stenting with Xience DES of circumflex & RCA  . LACERATION REPAIR  ~ 2009   right thumb  . LEFT HEART CATHETERIZATION WITH CORONARY ANGIOGRAM N/A 03/05/2012   Procedure: LEFT HEART CATHETERIZATION WITH CORONARY ANGIOGRAM;  Surgeon: Runell Gess, MD;  Location: [facility] CATH LAB;  Service: Cardiovascular;  Laterality: N/A;  . Rt arm surgery  1960's   "caught in washing machine; have had 2 skin grafts there"  . TONSILLECTOMY AND ADENOIDECTOMY     "when I was a kid"    No Known Allergies  I have reviewed the patient's current medications     Prior to Admission medications   Medication Sig Start Date End Date Taking? Authorizing Provider  aspirin 81 MG chewable tablet Chew 81 mg by mouth daily.   Yes Historical Provider, MD  carvedilol (COREG) 12.5 MG tablet TAKE 1 TABLET BY MOUTH 2 TIMES DAILY WITH A MEAL. 03/01/16  Yes Runell Gess, MD  clopidogrel (PLAVIX) 75 MG tablet Take 1 tablet (75 mg total) by mouth daily. 03/01/16  Yes Runell Gess, MD  famotidine (PEPCID) 40 MG tablet Take 1 tablet (40 mg total) by mouth daily. 03/28/16  Yes Runell Gess, MD  fexofenadine (ALLEGRA) 180 MG tablet Take 180 mg by mouth daily.   Yes Historical Provider, MD  gabapentin (NEURONTIN) 100 MG capsule Take 200 mg by mouth at bedtime. 01/10/16  Yes Historical Provider, MD  isosorbide mononitrate (IMDUR) 60 MG 24 hr tablet TAKE 1 1/2 TABLET (90 MG TOTAL) BY MOUTH DAILY. 07/28/16  Yes Rosalio Macadamia, NP  ketorolac (TORADOL) 10 MG tablet TAKE 1 TABLET 4 TIMES A DAY FOR SEVERE HEADACHE 06/28/16  Yes Historical Provider, MD  losartan (COZAAR) 50 MG tablet Take 1 tablet (50 mg total) by mouth daily. 03/01/16  Yes Runell Gess, MD  Multiple Vitamin (MULTIVITAMIN) tablet Take 1 tablet by mouth daily.   Yes Historical Provider, MD    NIFEdipine (PROCARDIA-XL/ADALAT-CC/NIFEDICAL-XL) 30 MG 24 hr tablet TAKE 1 TABLET (30 MG TOTAL) BY MOUTH DAILY. MUST MAKE APPOINTMENT FOR FUTURE REFILLS Patient taking differently: TAKE 1 TABLET (30 MG TOTAL) BY MOUTH DAILY. 03/20/16  Yes Runell Gess, MD  nitroGLYCERIN (NITROSTAT) 0.4 MG SL tablet Place 1 tablet (0.4 mg total) under the tongue every 5 (five) minutes as needed for chest pain. 03/01/16  Yes Runell Gess, MD  ondansetron (ZOFRAN) 8 MG tablet Take 8 mg by mouth every 8 (eight) hours as needed for nausea or vomiting.   Yes Historical Provider, MD  pravastatin (PRAVACHOL) 20 MG tablet TAKE 1 TABLET (20 MG TOTAL) BY MOUTH DAILY. 03/17/16  Yes Runell Gess, MD     Social History   Social History  . Marital status: Married    Spouse name: N/A  . Number of children: 1  . Years of education: N/A   Occupational History  . 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  [employer]   Social History Main Topics  . Smoking status: Never Smoker  . Smokeless tobacco: Never Used  . Alcohol use No     Comment: "quit drinking ~ 2010"  . Drug use: No  . Sexual activity: Not Currently   Other Topics Concern  . Not on file   Social History Narrative      Pt lives at home with his spouse.   Caffeine Use- Drinks soda daily    Family Status  Relation Status  . Father Alive  . Brother Alive  . Mother Alive  . Sister Alive  . Sister Alive  . Maternal Grandfather   . Other   . Maternal Grandmother   . Paternal Grandmother    Family History  Problem Relation Age of Onset  . Coronary artery disease Father     MI at 43, CABG  . Heart attack Father 6    now has ICD  . Coronary artery disease Brother     MI in 30s  . Heart attack Brother 30  . Cancer Mother   . Healthy Sister   . Healthy Sister   . Heart attack Maternal Grandfather     in 58s  . Heart disease Other     CABG in 40s  . Stroke Maternal Grandmother   . Heart disease Paternal Grandmother     CABGs in 9s        ROS:  Full 14 point review of systems complete and found to be negative unless listed above.  Physical Exam: Blood pressure 141/95, pulse 76, temperature 97.9 F (36.6 C), temperature source Oral, resp. rate 20, height 5\' 6"  (1.676 m), weight 160 lb (72.6 kg), SpO2 100 %.  General: Well developed, well nourished, male in no acute distress Head: Eyes PERRLA, No xanthomas. Normocephalic and atraumatic, oropharynx without edema or exudate.  Lungs: Resp regular and unlabored, CTA. Heart: RRR no s3, s4, or murmurs..   Neck: No carotid bruits. No lymphadenopathy.  No JVD. Abdomen:abdominal pain Msk:  No spine or cva tenderness. No weakness, no joint deformities or effusions. Extremities: No clubbing, cyanosis or edema. DP/PT/Radials 2+ and equal bilaterally. Neuro: Alert and oriented X 3. No focal deficits noted. Psych:  Good affect, responds appropriately Skin: No rashes or lesions noted.  Labs:   Lab Results  Component Value Date   WBC 7.9 09/18/2016   HGB 14.2 09/18/2016   HCT 41.6 09/18/2016   MCV 85.6 09/18/2016   PLT 181 09/18/2016   No results for input(s): INR in the last 72 hours.  Recent Labs Lab 09/18/16 0712  NA 137  K 3.7  CL 110  CO2 20*  BUN 13  CREATININE 0.71  CALCIUM 8.4*  GLUCOSE 115*   Magnesium  Date Value Ref Range Status  07/10/2009 1.8 1.5 - 2.5 mg/dL Final   No results for input(s): CKTOTAL, CKMB, TROPONINI in the last 72 hours.  Recent Labs  09/18/16 0736  TROPIPOC 0.00   Pro B Natriuretic peptide (BNP)  Date/Time Value Ref Range Status  03/28/2011 12:00 PM <30.0 0.0 - 100.0 pg/mL Final   Lab Results  Component Value Date   CHOL 135 02/23/2016   HDL 40 (L) 02/23/2016   LDLCALC 79 02/23/2016   TRIG 82 02/23/2016    Left Heart Cath and Coronary Angiography 07/31/16  Conclusion     Ramus lesion, 50 %stenosed.  1st Mrg lesion, 10 %stenosed.  Prox LAD to Dist LAD lesion, 20 %stenosed.  1st Diag lesion, 85  %stenosed.  Dist RCA lesion, 10 %stenosed.  RPDA lesion, 20 %stenosed.  The left ventricular systolic function is normal.  LV end diastolic pressure is normal.  The left ventricular ejection fraction is 55-65% by visual estimate.    Widely patent distal RCA and circumflex stents. Each artery is widely patent.  LAD contains luminal irregularities. A small first diagonal contains diffuse disease up to 80%. This is unchanged when compared to prior studies.  Ramus intermedius contains proximal segmental 50% narrowing. This is improved compared to prior studies.  Overall, no significant change to slight improvement when compared to 2013.  RECOMMENDATIONS:   Continue current medical regimen.  Entertain alternative explanations as the source of the patient's chest discomfort.   Echo 02/22/16 LV EF: 60% -   65%  ------------------------------------------------------------------- Indications:      Chest pain 786.51.  ------------------------------------------------------------------- History:   PMH:   Angina pectoris.  Coronary artery disease.  Risk factors:  Hypertension. Dyslipidemia.  ------------------------------------------------------------------- Study Conclusions  - Left ventricle: The cavity size was normal. Wall thickness was   increased in a pattern of mild LVH. Systolic function was normal.   The estimated ejection fraction was in the range of 60% to 65%.   Although no diagnostic regional wall motion abnormality was   identified, this possibility cannot be completely excluded on the   basis of this study. Features are consistent with a pseudonormal   left ventricular filling pattern, with concomitant abnormal   relaxation and increased filling pressure (grade 2 diastolic   dysfunction). - Aortic valve: There was no stenosis. - Mitral valve: There was trivial regurgitation. - Right ventricle: The cavity size was normal. Systolic function   was normal. -  Pulmonary arteries: No complete TR doppler jet so unable to   estimate PA systolic pressure. - Inferior vena cava: The vessel was normal in size. The   respirophasic diameter changes were in the normal range (>= 50%),   consistent with normal central venous pressure. - Pericardium, extracardiac: A trivial pericardial effusion was   identified.  Impressions:  - Normal LV size with mild LV hypertrophy. Moderate diastolic   dysfunction. EF 60-65%. Normal RV size and systolic function. No   significant valvular abnormalities.  Radiology:  Dg Chest 2 View  Result Date: 09/18/2016 CLINICAL DATA:  Chest pain. EXAM: CHEST  2 VIEW COMPARISON:  Radiographs of July 26, 2016. FINDINGS: The heart size and mediastinal contours are within normal limits. Both lungs are clear. No pneumothorax or pleural effusion is noted. The visualized skeletal structures are unremarkable. IMPRESSION: No active cardiopulmonary disease. Electronically Signed   By: Lupita RaiderJames  Green Jr, M.D.   On: 09/18/2016 07:54    ASSESSMENT AND PLAN:     1. Chest pain - atypical. EKG without acute changes. Recent cath 07/31/16 showed patent stent. 50% RI (improved from 70%), 85% Dx1. Continue current medical therapy. F/u with PCP for non cardiac pain. F/u with Dr. Allyson SabalBerry as schedule.   2. HTN - Borderline controlled. However he has abdominal pain after morphine. Advised to keep log.     SignedManson Passey: Chestina Komatsu, PA 09/18/2016, 10:35 AM Pager [phone]  Co-Sign MD

## 2016-10-23 ENCOUNTER — Other Ambulatory Visit: Payer: Self-pay | Admitting: Cardiovascular Disease

## 2016-12-16 ENCOUNTER — Other Ambulatory Visit: Payer: Self-pay | Admitting: Cardiovascular Disease

## 2017-01-01 DIAGNOSIS — R112 Nausea with vomiting, unspecified: Secondary | ICD-10-CM | POA: Diagnosis not present

## 2017-01-01 DIAGNOSIS — R51 Headache: Secondary | ICD-10-CM | POA: Diagnosis not present

## 2017-02-19 ENCOUNTER — Other Ambulatory Visit: Payer: Self-pay | Admitting: Cardiovascular Disease

## 2017-02-28 ENCOUNTER — Ambulatory Visit: Payer: 59 | Admitting: Cardiovascular Disease

## 2017-03-07 DIAGNOSIS — J309 Allergic rhinitis, unspecified: Secondary | ICD-10-CM | POA: Diagnosis not present

## 2017-03-07 DIAGNOSIS — G43119 Migraine with aura, intractable, without status migrainosus: Secondary | ICD-10-CM | POA: Diagnosis not present

## 2017-03-07 DIAGNOSIS — J0100 Acute maxillary sinusitis, unspecified: Secondary | ICD-10-CM | POA: Diagnosis not present

## 2017-03-16 ENCOUNTER — Ambulatory Visit (INDEPENDENT_AMBULATORY_CARE_PROVIDER_SITE_OTHER): Payer: 59 | Admitting: Cardiovascular Disease

## 2017-03-16 ENCOUNTER — Encounter: Payer: Self-pay | Admitting: Cardiovascular Disease

## 2017-03-16 VITALS — BP 138/84 | HR 73 | Ht 65.0 in | Wt 166.8 lb

## 2017-03-16 DIAGNOSIS — Z8249 Family history of ischemic heart disease and other diseases of the circulatory system: Secondary | ICD-10-CM

## 2017-03-16 DIAGNOSIS — E785 Hyperlipidemia, unspecified: Secondary | ICD-10-CM | POA: Diagnosis not present

## 2017-03-16 DIAGNOSIS — R600 Localized edema: Secondary | ICD-10-CM | POA: Insufficient documentation

## 2017-03-16 MED ORDER — HYDROCHLOROTHIAZIDE 12.5 MG PO CAPS: 12.5000 mg | ORAL_CAPSULE | Freq: Every day | ORAL | 3 refills | Status: DC

## 2017-03-16 MED ORDER — NITROGLYCERIN 0.4 MG SL SUBL: 0.4000 mg | SUBLINGUAL_TABLET | SUBLINGUAL | 3 refills | Status: DC | PRN

## 2017-03-16 MED ORDER — ISOSORBIDE MONONITRATE ER 60 MG PO TB24: 90.0000 mg | ORAL_TABLET | Freq: Every day | ORAL | 6 refills | Status: DC

## 2017-03-16 MED ORDER — CLOPIDOGREL BISULFATE 75 MG PO TABS: 75.0000 mg | ORAL_TABLET | Freq: Every day | ORAL | 11 refills | Status: DC

## 2017-03-16 MED ORDER — LOSARTAN POTASSIUM 50 MG PO TABS: 50.0000 mg | ORAL_TABLET | Freq: Every day | ORAL | 11 refills | Status: DC

## 2017-03-16 MED ORDER — CARVEDILOL 12.5 MG PO TABS: ORAL_TABLET | ORAL | 11 refills | Status: DC

## 2017-03-16 NOTE — Assessment & Plan Note (Signed)
Kyle Chavez is a history of essential hypertension with a blood pressure measured 138/84. He is on carvedilol, Procardia and losartan. Continue current meds at current dosing

## 2017-03-16 NOTE — Assessment & Plan Note (Signed)
History of dyslipidemia on statin therapy.  We will recheck a lipid and liver profile. 

## 2017-03-16 NOTE — Patient Instructions (Addendum)
Medication Instructions:  START Hctz 12.5mg  Take 1 tab once a day  Labwork: Your physician recommends that you return for lab work in: 3 weeks BMET, LIPID, LIVER FASTING    Testing/Procedures: NONE    Follow-Up: Your physician recommends that you schedule a follow-up appointment in: 3 MONTHS WITH EXTERNDER Your physician wants you to follow-up in: 12 MONTHS WITH DR Allyson SabalBERRY. You will receive a reminder letter in the mail two months in advance. If you don't receive a letter, please call our office to schedule the follow-up appointment.  Any Other Special Instructions Will Be Listed Below (If Applicable). WEAR COMPRESSION STOCKINGS DURING THE DAY AND REMOVE AT NIGHT  If you need a refill on your cardiac medications before your next appointment, please call your pharmacy.

## 2017-03-16 NOTE — Assessment & Plan Note (Signed)
History of CAD status post RCA and circumflex PCI stenting July 2010. The lower risk Myoview March 2017. He enjoyed cardiac catheterization by Dr. Katrinka BlazingSmith 07/31/16 revealing patent stents with an 85% first branch stenosis of 50% proximal ramus branch stenosis and normal LV function. The CAD was thought to be nonsignificant and medical therapy was recommended. He denies chest pain.

## 2017-03-16 NOTE — Assessment & Plan Note (Signed)
Mr. Kyle Chavez relates increasing bilateral lower extremity edema which is somewhat dependent and more noticeable at the end of the day. He does admit to dietary indiscretion with regards to salt. He also is on a calcium channel blocker. We talked about salt avoidance. I'm going to start a low-dose diuretic (Hydrocort thiazide 12 mg a day) and will check a basic metabolic panel in 3 weeks. I'm going to prescribe the high 20-30 mmHg compression stockings. We've talked about leg elevation at night. He'll see mid-level provider back in 3 months me back in one year

## 2017-03-16 NOTE — Progress Notes (Signed)
03/16/2017 Kyle Chavez   02-11-1966  [id number]  Primary Physician Lucila MaineSCOTT, ROBERT, MD Primary Cardiologist: Runell GessJonathan J Jadelin Eng MD Nicholes CalamityFACP, FACC, FAHA, [place]  HPI:  Kyle Chavez is a 51 year old.. male who presents for posthospital follow-up. I last saw him in the office 03/01/16.  Patient has a history of coronary artery disease with two-vessel PCI and stenting via the right radial approach in July of 2010 this was the circumflex artery in his right coronary artery. He did have moderately diffuse disease of the ramus branch of 70% which was treated medically. He does have obstructive sleep apnea on CPAP, he has hypertension, hyperlipidemia and a strong family history of heart disease. Recently his father had ICD placed. His father had an MI in his early 6240s, his paternal grandfather died of an MI at 53yo and his maternal grandfather died of an MI at 53yo.  He was catheterized by Dr. Herbie BaltimoreHarding on April 4th 2012 revealing patent stents and a diffusely diseased ramus branch, unchanged from previous. He was then catheterized by Dr. Allyson SabalBerry on March 05 2012 which showed similar anatomy.  In June 2014 he underwent exercise nuclear study which was normal no ischemia and normal LV wall motion. 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  Patient reports that in December 2014, he started having chest pressure radiating to his left arm and jaw, lasting up to 30 minutes and recurring after nitroglycerin. This was prompted by exertion but did not fully subside at rest. [facility] despite [facility]. Patient reportedly underwent cath which did not show anything new or acute. He was discharged home without any medication change.   He [facility] was [facility]16 through 02/23/16 with chest pain. He ruled out for myocardial infarction. A  Myoview stress test performed 02/23/16 was nonischemic and a 2-D echo was normal. He was discharged home and has had no recurrent symptoms.  Since then, he reports having occasional chest pressure, lasting a few minutes and resolving on its own, not requiring nitro. Pain occurs with exertion, not associated with shortness of breath, palpitations, nausea, diaphoresis or radiation to the arm and jaw. These episodes occur a couple of times per week.  He reports some dyspnea with going up a flight of stairs. He denies any lower extremity swelling. No orthopnea.  He wears his CPAP machine intermittently when he doesn't forget to wear it. He doesn't exercise. 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 He has never smoked. He is compliant with his medications.   Since he was seen a year ago he was admitted with atypical chest pain in August. He underwent cardiac catheterization by Dr. Katrinka BlazingSmith revealing a patent distal right and circumflex stent, 85% first diagonal branch stenosis and 50% proximal ramus branch stenosis with normal LV function. Medical therapy was recommended. He does complain of bilateral lower extremity edema worse towards the end of the day. He admits to dietary indiscretion with regards to salt.  Current Outpatient Prescriptions  Medication Sig Dispense Refill  . aspirin 81 MG chewable tablet Chew 81 mg by mouth daily.    . carvedilol (COREG) 12.5 MG tablet TAKE 1 TABLET BY MOUTH 2 TIMES DAILY WITH A MEAL. 60 tablet 11  . clopidogrel (PLAVIX) 75 MG tablet Take 1 tablet (  75 mg total) by mouth daily. 30 tablet 11  . famotidine (PEPCID) 40 MG tablet TAKE 1 TABLET BY MOUTH EVERY DAY 30 tablet 9  . fexofenadine (ALLEGRA) 180 MG tablet Take 180 mg by mouth daily.    Marland Kitchen gabapentin (NEURONTIN) 100 MG capsule Take 200 mg by mouth at bedtime.  2  . isosorbide mononitrate (IMDUR) 60 MG 24 hr tablet Take 1.5 tablets (90 mg total) by mouth daily. 45 tablet 6  . ketorolac  (TORADOL) 10 MG tablet TAKE 1 TABLET 4 TIMES A DAY FOR SEVERE HEADACHE  0  . losartan (COZAAR) 50 MG tablet Take 1 tablet (50 mg total) by mouth daily. 30 tablet 11  . Multiple Vitamin (MULTIVITAMIN) tablet Take 1 tablet by mouth daily.    Marland Kitchen NIFEdipine (PROCARDIA-XL/ADALAT-CC/NIFEDICAL-XL) 30 MG 24 hr tablet TAKE 1 TABLET (30 MG TOTAL) BY MOUTH DAILY. MUST MAKE APPOINTMENT FOR FUTURE REFILLS (Patient taking differently: TAKE 1 TABLET (30 MG TOTAL) BY MOUTH DAILY.) 30 tablet 11  . nitroGLYCERIN (NITROSTAT) 0.4 MG SL tablet Place 1 tablet (0.4 mg total) under the tongue every 5 (five) minutes as needed for chest pain. 25 tablet 3  . ondansetron (ZOFRAN) 8 MG tablet Take 8 mg by mouth every 8 (eight) hours as needed for nausea or vomiting.    . pravastatin (PRAVACHOL) 20 MG tablet TAKE 1 TABLET (20 MG TOTAL) BY MOUTH DAILY. 30 tablet 3   No current facility-administered medications for this visit.     No Known Allergies  Social History   Social History  . Marital status: Married    Spouse name: N/A  . Number of children: 1  . Years of education: N/A   Occupational History  . 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  [employer]   Social History Main Topics  . Smoking status: Never Smoker  . Smokeless tobacco: Never Used  . Alcohol use No     Comment: "quit drinking ~ 2010"  . Drug use: No  . Sexual activity: Not Currently   Other Topics Concern  . Not on file   Social History Narrative      Pt lives at home with his spouse.   Caffeine Use- Drinks soda daily     Review of Systems: General: negative for chills, fever, night sweats or weight changes.  Cardiovascular: negative for chest pain, dyspnea on exertion, edema, orthopnea, palpitations, paroxysmal nocturnal dyspnea or shortness of breath Dermatological: negative for rash Respiratory: negative for cough or wheezing Urologic: negative for hematuria Abdominal: negative for nausea, vomiting, diarrhea, bright red blood per  rectum, melena, or hematemesis Neurologic: negative for visual changes, syncope, or dizziness All other systems reviewed and are otherwise negative except as noted above.    Blood pressure 138/84, pulse 73, height 5\' 5"  (1.651 m), weight 166 lb 12.8 oz (75.7 kg).  General appearance: alert and no distress Neck: no adenopathy, no carotid bruit, no JVD, supple, symmetrical, trachea midline and thyroid not enlarged, symmetric, no tenderness/mass/nodules Lungs: clear to auscultation bilaterally Heart: regular rate and rhythm, S1, S2 normal, no murmur, click, rub or gallop Extremities: extremities normal, atraumatic, no cyanosis or edema  EKG sinus rhythm at 73 with poor R-wave progression. I personally reviewed this EKG  ASSESSMENT AND PLAN:   Essential hypertension Mr. Lipschutz is a history of essential hypertension with a blood pressure measured 138/84. He is on carvedilol, Procardia and losartan. Continue current meds at current dosing  CAD S/P PCI July 2010 History  of CAD status post RCA and circumflex PCI stenting July 2010. The lower risk Myoview March 2017. He enjoyed cardiac catheterization by Dr. Katrinka Blazing 07/31/16 revealing patent stents with an 85% first branch stenosis of 50% proximal ramus branch stenosis and normal LV function. The CAD was thought to be nonsignificant and medical therapy was recommended. He denies chest pain.  Dyslipidemia History of dyslipidemia on statin therapy. We will recheck a lipid and liver profile  Bilateral lower extremity edema Mr. Minix relates increasing bilateral lower extremity edema which is somewhat dependent and more noticeable at the end of the day. He does admit to dietary indiscretion with regards to salt. He also is on a calcium channel blocker. We talked about salt avoidance. I'm going to start a low-dose diuretic (Hydrocort thiazide 12 mg a day) and will check a basic metabolic panel in 3 weeks. I'm going to prescribe the high 20-30 mmHg  compression stockings. We've talked about leg elevation at night. He'll see mid-level provider back in 3 months me back in one year      Runell Gess MD [facility], [facility] 03/16/2017 10:07 AM

## 2017-04-01 DIAGNOSIS — R079 Chest pain, unspecified: Secondary | ICD-10-CM

## 2017-04-01 DIAGNOSIS — Z955 Presence of coronary angioplasty implant and graft: Secondary | ICD-10-CM | POA: Diagnosis not present

## 2017-04-01 DIAGNOSIS — I2510 Atherosclerotic heart disease of native coronary artery without angina pectoris: Secondary | ICD-10-CM | POA: Diagnosis not present

## 2017-04-01 DIAGNOSIS — J9690 Respiratory failure, unspecified, unspecified whether with hypoxia or hypercapnia: Secondary | ICD-10-CM | POA: Diagnosis not present

## 2017-04-01 DIAGNOSIS — I10 Essential (primary) hypertension: Secondary | ICD-10-CM | POA: Diagnosis not present

## 2017-04-01 DIAGNOSIS — E785 Hyperlipidemia, unspecified: Secondary | ICD-10-CM | POA: Diagnosis not present

## 2017-04-06 DIAGNOSIS — Z6824 Body mass index (BMI) 24.0-24.9, adult: Secondary | ICD-10-CM | POA: Diagnosis not present

## 2017-04-06 DIAGNOSIS — R933 Abnormal findings on diagnostic imaging of other parts of digestive tract: Secondary | ICD-10-CM | POA: Diagnosis not present

## 2017-04-06 DIAGNOSIS — I2510 Atherosclerotic heart disease of native coronary artery without angina pectoris: Secondary | ICD-10-CM | POA: Diagnosis not present

## 2017-04-13 DIAGNOSIS — R933 Abnormal findings on diagnostic imaging of other parts of digestive tract: Secondary | ICD-10-CM | POA: Diagnosis not present

## 2017-04-13 DIAGNOSIS — R935 Abnormal findings on diagnostic imaging of other abdominal regions, including retroperitoneum: Secondary | ICD-10-CM | POA: Diagnosis not present

## 2017-04-18 DIAGNOSIS — Q453 Other congenital malformations of pancreas and pancreatic duct: Secondary | ICD-10-CM | POA: Diagnosis not present

## 2017-04-18 DIAGNOSIS — K8689 Other specified diseases of pancreas: Secondary | ICD-10-CM | POA: Diagnosis not present

## 2017-04-18 DIAGNOSIS — K861 Other chronic pancreatitis: Secondary | ICD-10-CM | POA: Diagnosis not present

## 2017-04-24 DIAGNOSIS — E7800 Pure hypercholesterolemia, unspecified: Secondary | ICD-10-CM | POA: Diagnosis not present

## 2017-04-24 DIAGNOSIS — I252 Old myocardial infarction: Secondary | ICD-10-CM | POA: Diagnosis not present

## 2017-04-24 DIAGNOSIS — K862 Cyst of pancreas: Secondary | ICD-10-CM | POA: Diagnosis not present

## 2017-04-24 DIAGNOSIS — K869 Disease of pancreas, unspecified: Secondary | ICD-10-CM | POA: Diagnosis not present

## 2017-05-04 DIAGNOSIS — K869 Disease of pancreas, unspecified: Secondary | ICD-10-CM | POA: Diagnosis not present

## 2017-05-04 DIAGNOSIS — G43009 Migraine without aura, not intractable, without status migrainosus: Secondary | ICD-10-CM | POA: Diagnosis not present

## 2017-05-04 DIAGNOSIS — K862 Cyst of pancreas: Secondary | ICD-10-CM | POA: Diagnosis not present

## 2017-05-04 DIAGNOSIS — I252 Old myocardial infarction: Secondary | ICD-10-CM | POA: Diagnosis not present

## 2017-05-11 DIAGNOSIS — K862 Cyst of pancreas: Secondary | ICD-10-CM | POA: Diagnosis not present

## 2017-05-18 IMAGING — CR DG CHEST 2V
2 series · 2 of 2 positions shown · non-contrast
Comparison: Radiographs July 26, 2016.

CLINICAL DATA: Chest pain.

EXAM:
CHEST  2 VIEW

[chest pa]
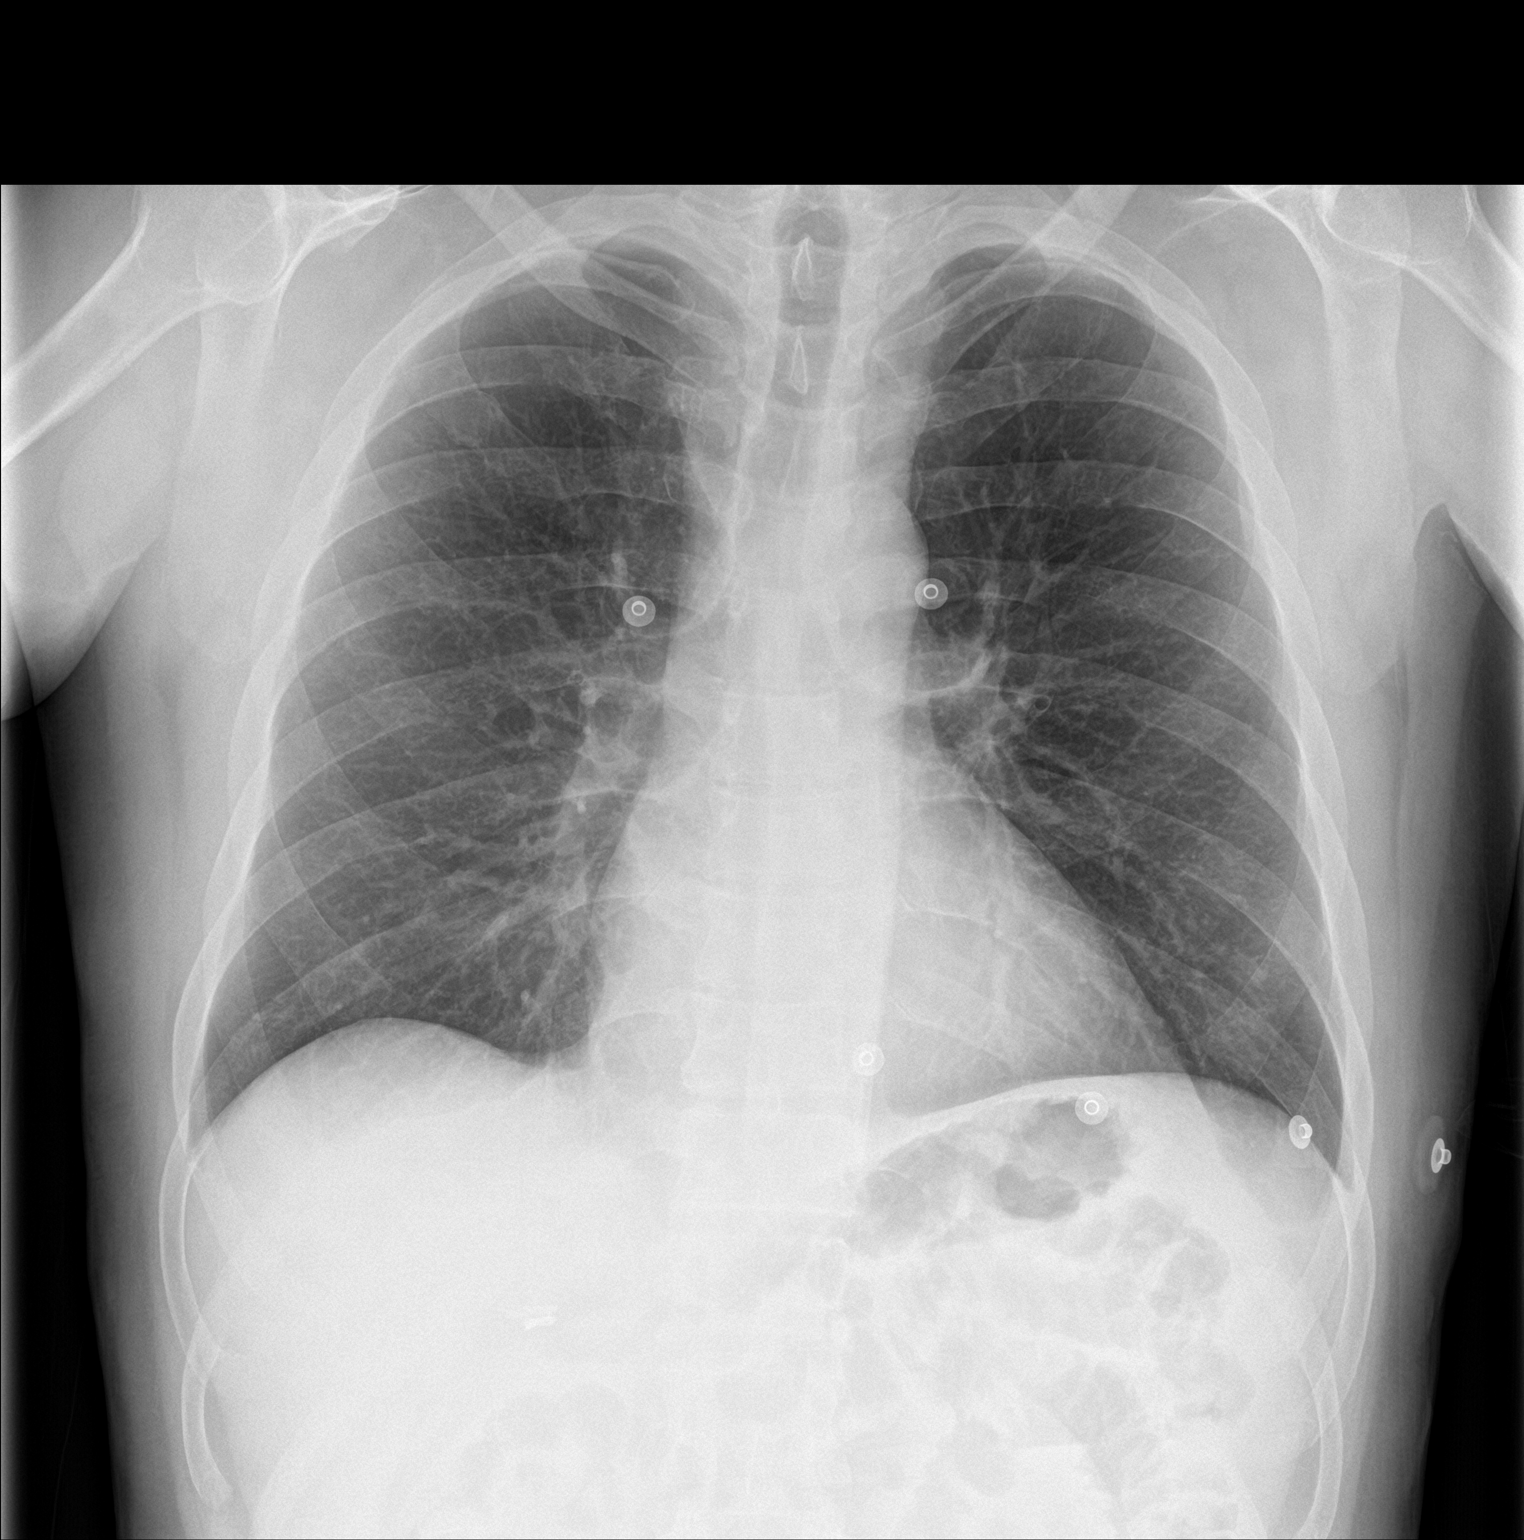

[chest lat]
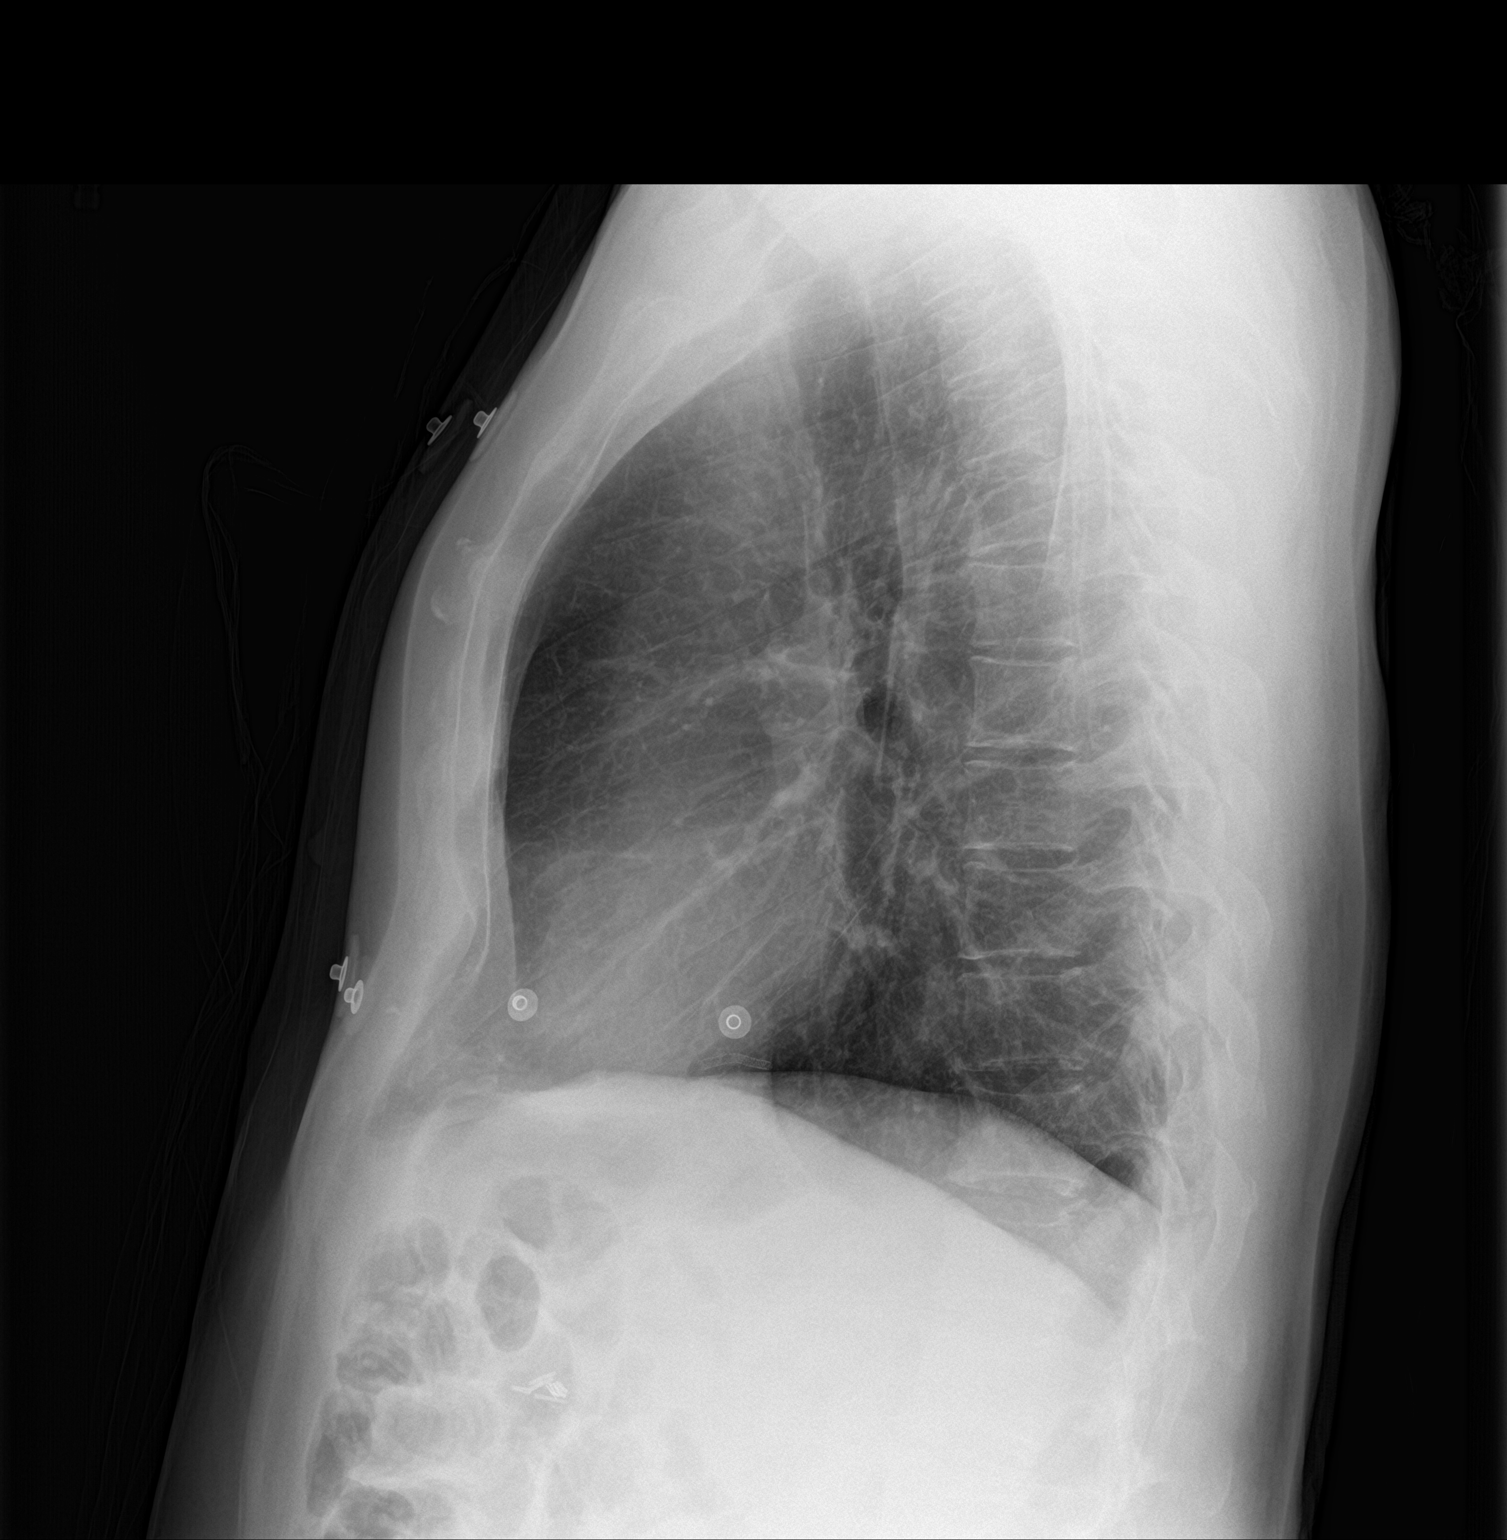

[2 of 2 positions shown; findings below may reference images not displayed]

FINDINGS: The heart size and mediastinal contours are within normal limits.
Both lungs are clear. No pneumothorax or pleural effusion is noted.
The visualized skeletal structures are unremarkable.
IMPRESSION: No active cardiopulmonary disease.

## 2017-05-28 ENCOUNTER — Other Ambulatory Visit: Payer: Self-pay | Admitting: Cardiovascular Disease

## 2017-05-28 NOTE — Telephone Encounter (Signed)
Rx request sent to pharmacy.  

## 2017-05-29 DIAGNOSIS — K862 Cyst of pancreas: Secondary | ICD-10-CM | POA: Diagnosis not present

## 2017-06-18 ENCOUNTER — Ambulatory Visit (INDEPENDENT_AMBULATORY_CARE_PROVIDER_SITE_OTHER): Payer: 59 | Admitting: Cardiology

## 2017-06-18 ENCOUNTER — Encounter: Payer: Self-pay | Admitting: Cardiology

## 2017-06-18 VITALS — BP 110/80 | HR 68 | Ht 66.0 in | Wt 165.0 lb

## 2017-06-18 DIAGNOSIS — K862 Cyst of pancreas: Secondary | ICD-10-CM | POA: Diagnosis not present

## 2017-06-18 DIAGNOSIS — I10 Essential (primary) hypertension: Secondary | ICD-10-CM

## 2017-06-18 DIAGNOSIS — Z9861 Coronary angioplasty status: Secondary | ICD-10-CM

## 2017-06-18 DIAGNOSIS — I2510 Atherosclerotic heart disease of native coronary artery without angina pectoris: Secondary | ICD-10-CM | POA: Diagnosis not present

## 2017-06-18 DIAGNOSIS — E785 Hyperlipidemia, unspecified: Secondary | ICD-10-CM | POA: Diagnosis not present

## 2017-06-18 NOTE — Patient Instructions (Signed)
Medication Instructions:  Your physician recommends that you continue on your current medications as directed. Please refer to the Current Medication list given to you today.  Follow-Up: Your physician wants you to follow-up in: 6 months with Dr. Berry. You will receive a reminder letter in the mail two months in advance. If you don't receive a letter, please call our office to schedule the follow-up appointment.   Any Other Special Instructions Will Be Listed Below (If Applicable).     If you need a refill on your cardiac medications before your next appointment, please call your pharmacy.   

## 2017-06-18 NOTE — Assessment & Plan Note (Signed)
Diagnosed March 2018- followed [facility]

## 2017-06-18 NOTE — Assessment & Plan Note (Signed)
LDL 70 March 2017

## 2017-06-18 NOTE — Assessment & Plan Note (Signed)
Controlled.  

## 2017-06-18 NOTE — Assessment & Plan Note (Signed)
RCA and CFX PCI in July 2010 C/P March 2017- Myoview low risk C/P Aug 2017- cath patent stents, 50% RI, 85% Dx1, normal LVF- medical Rx

## 2017-06-18 NOTE — Progress Notes (Signed)
06/18/2017 Kyle BlalockCurley L Folmar   Sep 04, 1966  [id number]  Primary Physician Heywood BeneScott, Robert B, MD Primary Cardiologist: Dr Allyson SabalBerry  HPI:  51 y.o. 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 He has a history of CAD, Kyle, HLD, OSA and HTN. He had CFX and RCA l PCI and stenting viaright radial approach July 2010. He did have moderately diffuse disease of his ramus branch which was 70% and treated medically. His had a [facility] in [facility] and he was told this was ok.  He presented to the office in 07/28/16 again with chest pain and was admitted for elective cath 08/01/16. This showed patent stents, 50% RI, 85% Dx1. Plan was for medical Rx.   He is in the office today with his wife for follow up. [facility] with chest pain felt to be non cardiac. Further evaluation revealed a pancreatic mass. [facility]. He has a pancreatic cyst and is being followed by Dr Flonnie HailstoneShen. F/U CT in June 2018 showed no change. He has done well from a cardiac standpoint and has no complaints today.    Current Outpatient Prescriptions  Medication Sig Dispense Refill  . aspirin 81 MG chewable tablet Chew 81 mg by mouth daily.    . carvedilol (COREG) 12.5 MG tablet TAKE 1 TABLET BY MOUTH 2 TIMES DAILY WITH A MEAL. 60 tablet 11  . clopidogrel (PLAVIX) 75 MG tablet Take 1 tablet (75 mg total) by mouth daily. 30 tablet 11  . famotidine (PEPCID) 40 MG tablet TAKE 1 TABLET BY MOUTH EVERY DAY 30 tablet 9  . fexofenadine (ALLEGRA) 180 MG tablet Take 180 mg by mouth daily.    Marland Kitchen. gabapentin (NEURONTIN) 100 MG capsule Take 200 mg by mouth at bedtime.  2  . isosorbide mononitrate (IMDUR) 60 MG 24 hr tablet Take 1.5 tablets (90 mg total) by mouth daily. 45 tablet 6  . ketorolac (TORADOL) 10 MG tablet TAKE 1 TABLET 4 TIMES A DAY FOR SEVERE HEADACHE  0  . losartan (COZAAR) 50 MG tablet Take 1 tablet (50 mg total) by mouth daily. 30 tablet 11  . Multiple Vitamin (MULTIVITAMIN) tablet Take 1 tablet  by mouth daily.    Marland Kitchen. NIFEdipine (PROCARDIA-XL/ADALAT-CC/NIFEDICAL-XL) 30 MG 24 hr tablet TAKE 1 TABLET (30 MG TOTAL) BY MOUTH DAILY. MUST MAKE APPOINTMENT FOR FUTURE REFILLS (Patient taking differently: TAKE 1 TABLET (30 MG TOTAL) BY MOUTH DAILY.) 30 tablet 11  . nitroGLYCERIN (NITROSTAT) 0.4 MG SL tablet Place 1 tablet (0.4 mg total) under the tongue every 5 (five) minutes as needed for chest pain. 25 tablet 3  . ondansetron (ZOFRAN) 8 MG tablet Take 8 mg by mouth every 8 (eight) hours as needed for nausea or vomiting.    . pravastatin (PRAVACHOL) 20 MG tablet TAKE 1 TABLET (20 MG TOTAL) BY MOUTH DAILY. 30 tablet 6  . hydrochlorothiazide (MICROZIDE) 12.5 MG capsule Take 1 capsule (12.5 mg total) by mouth daily. 90 capsule 3   No current facility-administered medications for this visit.     No Known Allergies  Past Medical History:  Diagnosis Date  . Angina   . CAD (coronary artery disease)   . Dyslipidemia   . Kyle (erectile dysfunction)   . Family history of heart disease   . GERD (gastroesophageal reflux disease)   . History of nuclear stress test 04/18/2011   exercise myoview; normal pattern of perfusion in all regions; low risk scan   .  Hypertension   . Migraine 03/05/12   "still have them every now and then"  . Myocardial infarction (HCC) 2010  . OSA on CPAP     Social History   Social History  . Marital status: Married    Spouse name: N/A  . Number of children: 1  . Years of education: N/A   Occupational History  . 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  [employer]   Social History Main Topics  . Smoking status: Never Smoker  . Smokeless tobacco: Never Used  . Alcohol use No     Comment: "quit drinking ~ 2010"  . Drug use: No  . Sexual activity: Not Currently   Other Topics Concern  . Not on file   Social History Narrative      Pt lives at home with his spouse.   Caffeine Use- Drinks soda daily     Family History  Problem Relation Age of Onset  . Coronary  artery disease Father        MI at 18, CABG  . Heart attack Father 90       now has ICD  . Coronary artery disease Brother        MI in 30s  . Heart attack Brother 30  . Cancer Mother   . Healthy Sister   . Healthy Sister   . Heart attack Maternal Grandfather        in 48s  . Heart disease Other        CABG in 40s  . Stroke Maternal Grandmother   . Heart disease Paternal Grandmother        CABGs in 68s     Review of Systems: General: negative for chills, fever, night sweats or weight changes.  Cardiovascular: negative for chest pain, dyspnea on exertion, edema, orthopnea, palpitations, paroxysmal nocturnal dyspnea or shortness of breath Dermatological: negative for rash Respiratory: negative for cough or wheezing Urologic: negative for hematuria Abdominal: negative for nausea, vomiting, diarrhea, bright red blood per rectum, melena, or hematemesis Neurologic: negative for visual changes, syncope, or dizziness All other systems reviewed and are otherwise negative except as noted above.    Blood pressure 110/80, pulse 68, height 5\' 6"  (1.676 m), weight 165 lb (74.8 kg).  General appearance: alert, cooperative and no distress Neck: no carotid bruit and no JVD Lungs: clear to auscultation bilaterally Heart: regular rate and rhythm Extremities: extremities normal, atraumatic, no cyanosis or edema Skin: Skin color, texture, turgor normal. 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   Corine Shelter PA-C 06/18/2017 9:31 AM

## 2017-06-20 DIAGNOSIS — Z0000 Encounter for general adult medical examination without abnormal findings: Secondary | ICD-10-CM | POA: Diagnosis not present

## 2017-07-09 ENCOUNTER — Telehealth: Payer: Self-pay | Admitting: Cardiovascular Disease

## 2017-07-09 ENCOUNTER — Other Ambulatory Visit: Payer: Self-pay

## 2017-07-09 MED ORDER — NIFEDIPINE ER OSMOTIC RELEASE 30 MG PO TB24: ORAL_TABLET | ORAL | 3 refills | Status: DC

## 2017-07-09 MED ORDER — NIFEDIPINE ER OSMOTIC RELEASE 30 MG PO TB24: ORAL_TABLET | ORAL | 1 refills | Status: DC

## 2017-07-09 MED ORDER — ISOSORBIDE MONONITRATE ER 60 MG PO TB24: 90.0000 mg | ORAL_TABLET | Freq: Every day | ORAL | 1 refills | Status: DC

## 2017-07-09 MED ORDER — ISOSORBIDE MONONITRATE ER 60 MG PO TB24: 90.0000 mg | ORAL_TABLET | Freq: Every day | ORAL | 3 refills | Status: DC

## 2017-07-09 NOTE — Telephone Encounter (Signed)
Toni AmendCourtney is calling about Nisedipine 30mg  ,need a new prescription sent and also wants to know if Dr. Allyson SabalBerry wrote the prescription for Isosobride . The patient just switched pharmacies . Please call

## 2017-07-09 NOTE — Telephone Encounter (Signed)
Returned call and spoke with Toni Amendourtney at [facility].  Verified list. Rx(s) sent to requested pharmacy with refill authorization.

## 2017-09-04 DIAGNOSIS — E279 Disorder of adrenal gland, unspecified: Secondary | ICD-10-CM | POA: Diagnosis not present

## 2017-09-04 DIAGNOSIS — K862 Cyst of pancreas: Secondary | ICD-10-CM | POA: Diagnosis not present

## 2017-09-10 DIAGNOSIS — R51 Headache: Secondary | ICD-10-CM | POA: Diagnosis not present

## 2017-09-10 DIAGNOSIS — G43909 Migraine, unspecified, not intractable, without status migrainosus: Secondary | ICD-10-CM | POA: Diagnosis not present

## 2017-11-22 DIAGNOSIS — Z23 Encounter for immunization: Secondary | ICD-10-CM | POA: Diagnosis not present

## 2017-11-27 ENCOUNTER — Ambulatory Visit: Payer: 59 | Admitting: Cardiovascular Disease

## 2017-12-05 ENCOUNTER — Other Ambulatory Visit: Payer: Self-pay | Admitting: Cardiovascular Disease

## 2017-12-05 NOTE — Telephone Encounter (Signed)
Rx(s) sent to pharmacy electronically.  

## 2017-12-08 ENCOUNTER — Other Ambulatory Visit: Payer: Self-pay | Admitting: Cardiovascular Disease

## 2017-12-31 DIAGNOSIS — B349 Viral infection, unspecified: Secondary | ICD-10-CM | POA: Diagnosis not present

## 2017-12-31 DIAGNOSIS — R509 Fever, unspecified: Secondary | ICD-10-CM | POA: Diagnosis not present

## 2018-01-01 ENCOUNTER — Encounter: Payer: Self-pay | Admitting: Cardiovascular Disease

## 2018-01-01 ENCOUNTER — Ambulatory Visit: Payer: 59 | Admitting: Cardiovascular Disease

## 2018-01-01 ENCOUNTER — Encounter (INDEPENDENT_AMBULATORY_CARE_PROVIDER_SITE_OTHER): Payer: Self-pay

## 2018-01-01 VITALS — BP 118/82 | HR 76 | Ht 66.0 in | Wt 170.0 lb

## 2018-01-01 DIAGNOSIS — Z9861 Coronary angioplasty status: Secondary | ICD-10-CM

## 2018-01-01 DIAGNOSIS — I10 Essential (primary) hypertension: Secondary | ICD-10-CM | POA: Diagnosis not present

## 2018-01-01 DIAGNOSIS — E785 Hyperlipidemia, unspecified: Secondary | ICD-10-CM | POA: Diagnosis not present

## 2018-01-01 DIAGNOSIS — I2510 Atherosclerotic heart disease of native coronary artery without angina pectoris: Secondary | ICD-10-CM

## 2018-01-01 LAB — LIPID PANEL
CHOL/HDL RATIO: 3.2 ratio (ref 0.0–5.0)
Cholesterol, Total: 138 mg/dL (ref 100–199)
HDL: 43 mg/dL (ref >39)
LDL CALC: 85 mg/dL (ref 0–99)
Triglycerides: 49 mg/dL (ref 0–149)
VLDL CHOLESTEROL CAL: 10 mg/dL (ref 5–40)

## 2018-01-01 LAB — HEPATIC FUNCTION PANEL
ALBUMIN: 4.5 g/dL (ref 3.5–5.5)
ALT: 27 IU/L (ref 0–44)
AST: 16 IU/L (ref 0–40)
Alkaline Phosphatase: 67 IU/L (ref 39–117)
BILIRUBIN TOTAL: 0.8 mg/dL (ref 0.0–1.2)
Bilirubin, Direct: 0.21 mg/dL (ref 0.00–0.40)
TOTAL PROTEIN: 7.1 g/dL (ref 6.0–8.5)

## 2018-01-01 NOTE — Assessment & Plan Note (Signed)
History of essential hypertension blood pressure measures 118/62. He is on hydrochlorothiazide losartan, carvedilol and nifedipine. Continue current meds at current dosing.

## 2018-01-01 NOTE — Progress Notes (Signed)
01/01/2018 Kyle Chavez   June 23, 1966  [id number]  Primary Physician Heywood BeneScott, Robert B, MD Primary Cardiologist: Runell GessJonathan J Tyee Vandevoorde MD FACP, [place]ACC, [place], [place]  HPI:  Kyle Chavez is a 52 y.o. male who I last saw him in the office 03/01/16.  He has a history of coronary artery disease with two-vessel PCI and stenting via the right radial approach in July of 2010 this was the circumflex artery in his right coronary artery. He did have moderately diffuse disease of the ramus branch of 70% which was treated medically. He does have obstructive sleep apnea on CPAP, he has hypertension, hyperlipidemia and a strong family history of heart disease. Recently his father had ICD placed. His father had an MI in his early 3040s, his paternal grandfather died of an MI at 54yo and his maternal grandfather died of an MI at 770yo.  He was catheterized by Dr. Herbie BaltimoreHarding on April 4th 2012 revealing patent stents and a diffusely diseased ramus branch, unchanged from previous. He was then catheterized by Dr. Allyson SabalBerry on March 05 2012 which showed similar anatomy.  In June 2014 he underwent exercise nuclear study which was normal no ischemia and normal LV wall motion. 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  Patient reports that in December 2014, he started having chest pressure radiating to his left arm and jaw, lasting up to 30 minutes and recurring after nitroglycerin. This was prompted by exertion but did not fully subside at rest. [facility] where [facility] despite patient's request [facility] his cardiologist group is. Patient reportedly underwent cath which did not show anything new or acute. He was discharged home without any medication change.   He recently was [facility] on 02/22/16 through 02/23/16 with chest pain. He ruled out for myocardial infarction. A Myoview stress test performed 02/23/16 was  nonischemic and a 2-D echo was normal. He was discharged home and has had no recurrent symptoms.  Since then, he reports having occasional chest pressure, lasting a few minutes and resolving on its own, not requiring nitro. Pain occurs with exertion, not associated with shortness of breath, palpitations, nausea, diaphoresis or radiation to the arm and jaw. These episodes occur a couple of times per week.  He reports some dyspnea with going up a flight of stairs. He denies any lower extremity swelling. No orthopnea.  He wears his CPAP machine intermittently when he doesn't forget to wear it. He doesn't exercise. 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 He has never smoked. He is compliant with his medications.    He underwent cardiac catheterization by Dr. Katrinka BlazingSmith 07/31/16 revealing a patent distal right and circumflex stent, 85% first diagonal branch stenosis and 50% proximal ramus branch stenosis with normal LV function. Medical therapy was recommended. Since I saw him a year ago he's remained asymptomatic. [facility]      Current Meds  Medication Sig  . aspirin 81 MG chewable tablet Chew 81 mg by mouth daily.  . carvedilol (COREG) 12.5 MG tablet TAKE 1 TABLET BY MOUTH 2 TIMES DAILY WITH A MEAL.  Marland Kitchen. clopidogrel (PLAVIX) 75 MG tablet Take 1 tablet (75 mg total) by mouth daily.  . famotidine (PEPCID) 40 MG tablet TAKE 1 TABLET BY MOUTH EVERY DAY  .  fexofenadine (ALLEGRA) 180 MG tablet Take 180 mg by mouth daily.  Marland Kitchen gabapentin (NEURONTIN) 100 MG capsule Take 200 mg by mouth at bedtime.  . hydrochlorothiazide (MICROZIDE) 12.5 MG capsule Take 1 capsule (12.5 mg total) by mouth daily.  . isosorbide mononitrate (IMDUR) 60 MG 24 hr tablet Take 1.5 tablets (90 mg total) by mouth daily.  Marland Kitchen losartan (COZAAR) 50 MG tablet Take 1 tablet (50 mg total) by mouth daily.  . Multiple Vitamin (MULTIVITAMIN) tablet  Take 1 tablet by mouth daily.  Marland Kitchen NIFEdipine (PROCARDIA-XL/ADALAT-CC/NIFEDICAL-XL) 30 MG 24 hr tablet TAKE 1 TABLET (30 MG TOTAL) BY MOUTH DAILY.  . nitroGLYCERIN (NITROSTAT) 0.4 MG SL tablet Place 1 tablet (0.4 mg total) under the tongue every 5 (five) minutes as needed for chest pain.  Marland Kitchen ondansetron (ZOFRAN) 8 MG tablet Take 8 mg by mouth every 8 (eight) hours as needed for nausea or vomiting.  . pravastatin (PRAVACHOL) 20 MG tablet TAKE 1 TABLET BY MOUTH ONCE (1) DAILY     No Known Allergies  Social History   Socioeconomic History  . Marital status: Married    Spouse name: Not on file  . Number of children: 1  . Years of education: Not on file  . Highest education level: Not on file  Social Needs  . Financial resource strain: Not on file  . Food insecurity - worry: Not on file  . Food insecurity - inability: Not on file  . Transportation needs - medical: Not on file  . Transportation needs - non-medical: Not on file  Occupational History  . Occupation: Truck Hospital doctor    Comment: drives for [employer]: [employer]  Tobacco Use  . Smoking status: Never Smoker  . Smokeless tobacco: Never Used  Substance and Sexual Activity  . Alcohol use: No    Comment: "quit drinking ~ 2010"  . Drug use: No  . Sexual activity: Not Currently  Other Topics Concern  . Not on file  Social History Narrative      Pt lives at home with his spouse.   Caffeine Use- Drinks soda daily     Review of Systems: General: negative for chills, fever, night sweats or weight changes.  Cardiovascular: negative for chest pain, dyspnea on exertion, edema, orthopnea, palpitations, paroxysmal nocturnal dyspnea or shortness of breath Dermatological: negative for rash Respiratory: negative for cough or wheezing Urologic: negative for hematuria Abdominal: negative for nausea, vomiting, diarrhea, bright red blood per rectum, melena, or hematemesis Neurologic: negative for visual changes, syncope, or  dizziness All other systems reviewed and are otherwise negative except as noted above.    Blood pressure 118/82, pulse 76, height 5\' 6"  (1.676 m), weight 170 lb (77.1 kg).  General appearance: alert and no distress Neck: no adenopathy, no carotid bruit, no JVD, supple, symmetrical, trachea midline and thyroid not enlarged, symmetric, no tenderness/mass/nodules Lungs: clear to auscultation bilaterally Heart: regular rate and rhythm, S1, S2 normal, no murmur, click, rub or gallop Extremities: extremities normal, atraumatic, no cyanosis or edema Pulses: 2+ and symmetric Skin: Skin color, texture, turgor normal. No rashes or lesions Neurologic: Alert and oriented X 3, normal strength and tone. Normal symmetric reflexes. Normal coordination and gait  EKG sinus rhythm at 74 without ST or T-wave changes. I personally reviewed this EKG.  ASSESSMENT AND PLAN:   Essential hypertension History of essential hypertension blood pressure measures 118/62. He is on hydrochlorothiazide losartan, carvedilol and nifedipine. Continue current meds at current dosing.  CAD S/P PCI July  2010 History of CAD status post PCI and stenting of circumflex and RCA July 2010 via the right radial approach. He's had multiple catheterizations by myself, Dr. Herbie Baltimore and most recently Dr. Katrinka Blazing 07/31/16 that demonstrated his density widely patent with disease in his diagonal branch and ramus branch and normal LV function. Since I saw him closely her ago he has remained asymptomatic.  Dyslipidemia History of dyslipidemia on statin therapy followed by his PCP. I am going to recheck a lipid and liver profile.      Runell Gess MD FACP,FACC,FAHA, [facility] 01/01/2018 10:02 AM

## 2018-01-01 NOTE — Patient Instructions (Signed)
Medication Instructions:  Continue current ,medications  If you need a refill on your cardiac medications before your next appointment, please call your pharmacy.  Labwork: Fasting Lipid Liver HERE IN OUR OFFICE AT [facility]  Take the provided lab slips for you to take with you to the lab for you blood draw.   You will need to fast. DO NOT EAT OR DRINK PAST MIDNIGHT.   You may go to any [facility] lab that is convenient for you however, we do have a lab in our office that is able to assist you. You do NOT need an appointment for our lab. Once in our office lobby there is a podium to the right of the check-in desk where you are to sign-in and ring a doorbell to alert us you are here. Lab is open Monday-Friday from 8:00am to 4:00pm; and is closed for lunch from 12:45p-1:45pm   Testing/Procedures: None Ordered   Follow-Up: Your physician wants you to follow-up in: 1 Year with Dr Allyson SabalBerry. You should receive a reminder letter in the mail two months in advance. If you do not receive a letter, please call our office [phone].   Thank you for choosing [facility] at [facility]!!

## 2018-01-01 NOTE — Assessment & Plan Note (Signed)
History of dyslipidemia on statin therapy followed by his PCP. I am going to recheck a lipid and liver profile.

## 2018-01-01 NOTE — Assessment & Plan Note (Signed)
History of CAD status post PCI and stenting of circumflex and RCA July 2010 via the right radial approach. He's had multiple catheterizations by myself, Dr. Herbie BaltimoreHarding and most recently Dr. Katrinka BlazingSmith 07/31/16 that demonstrated his density widely patent with disease in his diagonal branch and ramus branch and normal LV function. Since I saw him closely her ago he has remained asymptomatic.

## 2018-01-02 ENCOUNTER — Encounter: Payer: Self-pay | Admitting: Cardiovascular Disease

## 2018-01-07 ENCOUNTER — Other Ambulatory Visit: Payer: Self-pay | Admitting: Cardiovascular Disease

## 2018-01-07 NOTE — Telephone Encounter (Signed)
Rx(s) sent to pharmacy electronically.  

## 2018-01-21 ENCOUNTER — Telehealth: Payer: Self-pay | Admitting: Cardiovascular Disease

## 2018-01-21 NOTE — Telephone Encounter (Signed)
New message     Pt c/o medication issue:  1. Name of Medication: losartan (COZAAR) 50 MG tablet  2. How are you currently taking this medication (dosage and times per day)? As prescribed  3. Are you having a reaction (difficulty breathing--STAT)? no  4. What is your medication issue? Received letter stating medication recalled

## 2018-01-21 NOTE — Telephone Encounter (Signed)
Called patient and explained that only certain manufacturers/lot numbers of losartan are on recall. Suggested he call his pharmacy to find out if his particular med was recalled. He voiced understanding.

## 2018-02-08 ENCOUNTER — Other Ambulatory Visit: Payer: Self-pay | Admitting: Cardiovascular Disease

## 2018-02-08 DIAGNOSIS — Z8249 Family history of ischemic heart disease and other diseases of the circulatory system: Secondary | ICD-10-CM

## 2018-02-08 NOTE — Telephone Encounter (Signed)
REFILL 

## 2018-03-05 ENCOUNTER — Other Ambulatory Visit: Payer: Self-pay | Admitting: Cardiovascular Disease

## 2018-03-06 NOTE — Telephone Encounter (Signed)
Rx(s) sent to pharmacy electronically.  

## 2018-03-11 ENCOUNTER — Other Ambulatory Visit: Payer: Self-pay | Admitting: Cardiovascular Disease

## 2018-03-11 NOTE — Telephone Encounter (Signed)
REFILL 

## 2018-03-25 DIAGNOSIS — R1110 Vomiting, unspecified: Secondary | ICD-10-CM | POA: Diagnosis not present

## 2018-03-25 DIAGNOSIS — B349 Viral infection, unspecified: Secondary | ICD-10-CM | POA: Diagnosis not present

## 2018-04-02 DIAGNOSIS — K862 Cyst of pancreas: Secondary | ICD-10-CM | POA: Diagnosis not present

## 2018-04-05 ENCOUNTER — Other Ambulatory Visit: Payer: Self-pay | Admitting: Cardiovascular Disease

## 2018-04-16 DIAGNOSIS — J40 Bronchitis, not specified as acute or chronic: Secondary | ICD-10-CM | POA: Diagnosis not present

## 2018-04-16 DIAGNOSIS — J329 Chronic sinusitis, unspecified: Secondary | ICD-10-CM | POA: Diagnosis not present

## 2018-05-13 ENCOUNTER — Other Ambulatory Visit: Payer: Self-pay | Admitting: Cardiovascular Disease

## 2018-05-13 NOTE — Telephone Encounter (Signed)
Rx has been sent to the pharmacy electronically. ° °

## 2018-07-09 ENCOUNTER — Telehealth: Payer: Self-pay | Admitting: Cardiovascular Disease

## 2018-07-09 NOTE — Telephone Encounter (Signed)
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

## 2018-07-09 NOTE — Telephone Encounter (Signed)
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

## 2018-07-09 NOTE — Telephone Encounter (Signed)
Returned call to patient. He is requesting 2 letters.   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   1 letter needs to state he does not need to wear CPAP any longer. He states it is not working for him   Both letters need to be addressed to [place] or [place] @ [facility] and can be mailed to him   Routed to MD and primary nurse

## 2018-07-10 ENCOUNTER — Encounter: Payer: Self-pay | Admitting: *Deleted

## 2018-07-10 NOTE — Telephone Encounter (Signed)
Spoke with pt, letter generated and mailed to his confirmed home address.

## 2018-07-10 NOTE — Telephone Encounter (Signed)
Left message for pt to call, ? Can they be the same letter?

## 2018-07-17 ENCOUNTER — Telehealth: Payer: Self-pay | Admitting: Cardiovascular Disease

## 2018-07-17 ENCOUNTER — Other Ambulatory Visit: Payer: Self-pay | Admitting: Cardiovascular Disease

## 2018-07-17 NOTE — Telephone Encounter (Signed)
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

## 2018-07-17 NOTE — Telephone Encounter (Signed)
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 letter re-mailed per his request. Advised pt that I could keep a copy at the front desk but it is far for his wife to drive to pick up.

## 2018-07-17 NOTE — Telephone Encounter (Signed)
Rx sent to pharmacy   

## 2018-07-19 DIAGNOSIS — R61 Generalized hyperhidrosis: Secondary | ICD-10-CM | POA: Diagnosis not present

## 2018-07-22 DIAGNOSIS — I10 Essential (primary) hypertension: Secondary | ICD-10-CM | POA: Diagnosis not present

## 2018-07-22 DIAGNOSIS — R55 Syncope and collapse: Secondary | ICD-10-CM | POA: Diagnosis not present

## 2018-07-22 DIAGNOSIS — R3129 Other microscopic hematuria: Secondary | ICD-10-CM | POA: Diagnosis not present

## 2018-07-22 DIAGNOSIS — N503 Cyst of epididymis: Secondary | ICD-10-CM | POA: Diagnosis not present

## 2018-07-22 DIAGNOSIS — R42 Dizziness and giddiness: Secondary | ICD-10-CM | POA: Diagnosis not present

## 2018-07-22 DIAGNOSIS — N433 Hydrocele, unspecified: Secondary | ICD-10-CM | POA: Diagnosis not present

## 2018-07-22 DIAGNOSIS — R319 Hematuria, unspecified: Secondary | ICD-10-CM | POA: Diagnosis not present

## 2018-07-22 DIAGNOSIS — N5089 Other specified disorders of the male genital organs: Secondary | ICD-10-CM | POA: Diagnosis not present

## 2018-08-16 DIAGNOSIS — K7689 Other specified diseases of liver: Secondary | ICD-10-CM | POA: Diagnosis not present

## 2018-08-16 DIAGNOSIS — R319 Hematuria, unspecified: Secondary | ICD-10-CM | POA: Diagnosis not present

## 2018-10-03 DIAGNOSIS — R351 Nocturia: Secondary | ICD-10-CM | POA: Diagnosis not present

## 2018-10-03 DIAGNOSIS — N529 Male erectile dysfunction, unspecified: Secondary | ICD-10-CM | POA: Diagnosis not present

## 2018-10-03 DIAGNOSIS — N401 Enlarged prostate with lower urinary tract symptoms: Secondary | ICD-10-CM | POA: Diagnosis not present

## 2018-10-11 DIAGNOSIS — K862 Cyst of pancreas: Secondary | ICD-10-CM | POA: Diagnosis not present

## 2018-10-22 ENCOUNTER — Other Ambulatory Visit: Payer: Self-pay | Admitting: Cardiovascular Disease

## 2018-11-13 DIAGNOSIS — Z23 Encounter for immunization: Secondary | ICD-10-CM | POA: Diagnosis not present

## 2018-11-13 DIAGNOSIS — G43709 Chronic migraine without aura, not intractable, without status migrainosus: Secondary | ICD-10-CM | POA: Diagnosis not present

## 2018-11-13 DIAGNOSIS — Z0001 Encounter for general adult medical examination with abnormal findings: Secondary | ICD-10-CM | POA: Diagnosis not present

## 2018-12-03 DIAGNOSIS — G43909 Migraine, unspecified, not intractable, without status migrainosus: Secondary | ICD-10-CM | POA: Diagnosis not present

## 2018-12-03 DIAGNOSIS — R51 Headache: Secondary | ICD-10-CM | POA: Diagnosis not present

## 2018-12-03 DIAGNOSIS — R0789 Other chest pain: Secondary | ICD-10-CM | POA: Diagnosis not present

## 2018-12-03 DIAGNOSIS — R42 Dizziness and giddiness: Secondary | ICD-10-CM | POA: Diagnosis not present

## 2018-12-23 ENCOUNTER — Other Ambulatory Visit: Payer: Self-pay | Admitting: Cardiovascular Disease

## 2019-01-27 ENCOUNTER — Other Ambulatory Visit: Payer: Self-pay | Admitting: Cardiovascular Disease

## 2019-01-27 NOTE — Telephone Encounter (Signed)
Rx request sent to pharmacy.  

## 2019-02-28 ENCOUNTER — Other Ambulatory Visit: Payer: Self-pay

## 2019-02-28 DIAGNOSIS — Z8249 Family history of ischemic heart disease and other diseases of the circulatory system: Secondary | ICD-10-CM

## 2019-02-28 MED ORDER — HYDROCHLOROTHIAZIDE 12.5 MG PO CAPS: 12.5000 mg | ORAL_CAPSULE | Freq: Every day | ORAL | 0 refills | Status: DC

## 2019-02-28 NOTE — Telephone Encounter (Signed)
Rx(s) sent to pharmacy electronically.  

## 2019-03-05 DIAGNOSIS — S8001XA Contusion of right knee, initial encounter: Secondary | ICD-10-CM | POA: Diagnosis not present

## 2019-03-05 DIAGNOSIS — M25561 Pain in right knee: Secondary | ICD-10-CM | POA: Diagnosis not present

## 2019-03-05 DIAGNOSIS — S8000XA Contusion of unspecified knee, initial encounter: Secondary | ICD-10-CM | POA: Diagnosis not present

## 2019-03-12 ENCOUNTER — Other Ambulatory Visit: Payer: Self-pay | Admitting: Cardiovascular Disease

## 2019-03-12 DIAGNOSIS — S8001XD Contusion of right knee, subsequent encounter: Secondary | ICD-10-CM | POA: Diagnosis not present

## 2019-03-12 DIAGNOSIS — S8001XA Contusion of right knee, initial encounter: Secondary | ICD-10-CM | POA: Diagnosis not present

## 2019-03-13 ENCOUNTER — Telehealth: Payer: Self-pay | Admitting: *Deleted

## 2019-03-13 ENCOUNTER — Telehealth: Payer: Self-pay

## 2019-03-13 DIAGNOSIS — Z8249 Family history of ischemic heart disease and other diseases of the circulatory system: Secondary | ICD-10-CM

## 2019-03-13 MED ORDER — HYDROCHLOROTHIAZIDE 12.5 MG PO CAPS: 12.5000 mg | ORAL_CAPSULE | Freq: Every day | ORAL | 1 refills | Status: DC

## 2019-03-13 MED ORDER — CLOPIDOGREL BISULFATE 75 MG PO TABS: 75.0000 mg | ORAL_TABLET | Freq: Every day | ORAL | 1 refills | Status: DC

## 2019-03-13 MED ORDER — ISOSORBIDE MONONITRATE ER 60 MG PO TB24: 90.0000 mg | ORAL_TABLET | Freq: Every day | ORAL | 1 refills | Status: DC

## 2019-03-13 MED ORDER — NIFEDIPINE ER OSMOTIC RELEASE 30 MG PO TB24: ORAL_TABLET | ORAL | 1 refills | Status: DC

## 2019-03-13 MED ORDER — PRAVASTATIN SODIUM 20 MG PO TABS: ORAL_TABLET | ORAL | 1 refills | Status: DC

## 2019-03-13 MED ORDER — LOSARTAN POTASSIUM 50 MG PO TABS: ORAL_TABLET | ORAL | 1 refills | Status: DC

## 2019-03-13 MED ORDER — CARVEDILOL 12.5 MG PO TABS: ORAL_TABLET | ORAL | 1 refills | Status: DC

## 2019-03-13 NOTE — Telephone Encounter (Signed)
   Cardiac Questionnaire:    Since your last visit or hospitalization:    1. Have you been having new or worsening chest pain? no   2. Have you been having new or worsening shortness of breath? no 3. Have you been having new or worsening leg swelling, wt gain, or increase in abdominal girth (pants fitting more tightly)? no   4. Have you had any passing out spells? no    *A YES to any of these questions would result in the appointment being kept. *If all the answers to these questions are NO, we should indicate that given the current situation regarding the worldwide coronarvirus pandemic, at the recommendation of the CDC, we are looking to limit gatherings in our waiting area, and thus will reschedule their appointment beyond four weeks from today.   _____________     Primary Cardiologist:  Nanetta Batty, MD   Patient contacted.  History reviewed.  No symptoms to suggest any unstable cardiac conditions.  Based on discussion, with current pandemic situation, we will be postponing this appointment for @PATIENTNAME @.  If symptoms change, he has been instructed to contact our office.     Deliah Goody, RN  03/13/2019 3:48 PM         .

## 2019-03-14 ENCOUNTER — Ambulatory Visit: Payer: 59 | Admitting: Cardiovascular Disease

## 2019-03-14 DIAGNOSIS — S8001XA Contusion of right knee, initial encounter: Secondary | ICD-10-CM | POA: Diagnosis not present

## 2019-03-17 DIAGNOSIS — S8001XA Contusion of right knee, initial encounter: Secondary | ICD-10-CM | POA: Diagnosis not present

## 2019-03-17 DIAGNOSIS — S8001XD Contusion of right knee, subsequent encounter: Secondary | ICD-10-CM | POA: Diagnosis not present

## 2019-03-20 NOTE — Telephone Encounter (Signed)
Encounter closed

## 2019-03-25 DIAGNOSIS — S8001XD Contusion of right knee, subsequent encounter: Secondary | ICD-10-CM | POA: Diagnosis not present

## 2019-04-03 ENCOUNTER — Telehealth: Payer: Self-pay | Admitting: Cardiovascular Disease

## 2019-04-03 ENCOUNTER — Other Ambulatory Visit: Payer: Self-pay

## 2019-04-03 NOTE — Telephone Encounter (Signed)
Smart phone/ my chart via text/ pre reg completed °

## 2019-04-04 ENCOUNTER — Telehealth (INDEPENDENT_AMBULATORY_CARE_PROVIDER_SITE_OTHER): Payer: 59 | Admitting: Cardiovascular Disease

## 2019-04-04 ENCOUNTER — Telehealth: Payer: Self-pay

## 2019-04-04 DIAGNOSIS — I2510 Atherosclerotic heart disease of native coronary artery without angina pectoris: Secondary | ICD-10-CM | POA: Diagnosis not present

## 2019-04-04 DIAGNOSIS — I10 Essential (primary) hypertension: Secondary | ICD-10-CM

## 2019-04-04 DIAGNOSIS — E785 Hyperlipidemia, unspecified: Secondary | ICD-10-CM | POA: Diagnosis not present

## 2019-04-04 DIAGNOSIS — Z9861 Coronary angioplasty status: Secondary | ICD-10-CM

## 2019-04-04 MED ORDER — CARVEDILOL 12.5 MG PO TABS: ORAL_TABLET | ORAL | 3 refills | Status: DC

## 2019-04-04 MED ORDER — PRAVASTATIN SODIUM 20 MG PO TABS: ORAL_TABLET | ORAL | 3 refills | Status: DC

## 2019-04-04 MED ORDER — LOSARTAN POTASSIUM 50 MG PO TABS: ORAL_TABLET | ORAL | 3 refills | Status: AC

## 2019-04-04 MED ORDER — NIFEDIPINE ER OSMOTIC RELEASE 30 MG PO TB24: ORAL_TABLET | ORAL | 3 refills | Status: DC

## 2019-04-04 NOTE — Patient Instructions (Signed)
Medication Instructions:  Your physician recommends that you continue on your current medications as directed. Please refer to the Current Medication list given to you today.  If you need a refill on your cardiac medications before your next appointment, please call your pharmacy.   Lab work: Your physician recommends that you return for lab work in 2-3 weeks: FASTING LIPID PROFILE; LIVER FUNCTION TEST. You do not need an appointment and will receive a lab slip in the mail. Please do not eat or drink (except water) after midnight on the day you will come in for lab work. You may eat and drink as you wish AFTER your blood has been collected. If you have labs (blood work) drawn today and your tests are completely normal, you will receive your results only by: Marland Kitchen MyChart Message (if you have MyChart) OR . A paper copy in the mail If you have any lab test that is abnormal or we need to change your treatment, we will call you to review the results.  Testing/Procedures: NONE  Follow-Up: At [facility], you and your health needs are our priority.  As part of our continuing mission to provide you with exceptional heart care, we have created designated Provider Care Teams.  These Care Teams include your primary Cardiologist (physician) and Advanced Practice Providers (APPs -  Physician Assistants and Nurse Practitioners) who all work together to provide you with the care you need, when you need it. You will need a follow up appointment in 12 months WITH DR. Allyson Sabal.  Please call our office 2 months in advance to schedule this appointment.

## 2019-04-04 NOTE — Telephone Encounter (Signed)
Virtual Visit Pre-Appointment Phone Call  Steps For Call:  1. Confirm consent - "In the setting of the current Covid19 crisis, you are scheduled for a (phone or video) visit with your provider on (date) at (time).  Just as we do with many in-office visits, in order for you to participate in this visit, we must obtain consent.  If you'd like, I can send this to your mychart (if signed up) or email for you to review.  Otherwise, I can obtain your verbal consent now.  All virtual visits are billed to your insurance company just like a normal visit would be.  By agreeing to a virtual visit, we'd like you to understand that the technology does not allow for your provider to perform an examination, and thus may limit your provider's ability to fully assess your condition.  Finally, though the technology is pretty good, we cannot assure that it will always work on either your or our end, and in the setting of a video visit, we may have to convert it to a phone-only visit.  In either situation, we cannot ensure that we have a secure connection.  Are you willing to proceed?"  2. Give patient instructions for WebEx download to smartphone as below if video visit  3. Advise patient to be prepared with any vital sign or heart rhythm information, their current medicines, and a piece of paper and pen handy for any instructions they may receive the day of their visit  4. Inform patient they will receive a phone call 15 minutes prior to their appointment time (may be from unknown caller ID) so they should be prepared to answer  5. Confirm that appointment type is correct in Epic appointment notes (video vs telephone)    TELEPHONE CALL NOTE  Kyle Chavez has been deemed a candidate for a follow-up tele-health visit to limit community exposure during the Covid-19 pandemic. I spoke with the patient via phone to ensure availability of phone/video source, confirm preferred email & phone number, and discuss  instructions and expectations.  I reminded Sebron L Harwood to be prepared with any vital sign and/or heart rhythm information that could potentially be obtained via home monitoring, at the time of his visit. I reminded Ed Blalock to expect a phone call at the time of his visit if his visit.  Did the patient verbally acknowledge consent to treatment? YES  Brandin Stetzer Cottie Banda, RN 04/04/2019    DOWNLOADING THE WEBEX SOFTWARE TO SMARTPHONE  - If Apple, go to App Store and type in WebEx in the search bar. Download [employer], the blue/green circle. The app is free but as with any other app downloads, their phone may require them to verify saved payment information or Apple password. The patient does NOT have to create an account.  - If Android, ask patient to go to [employer] and type in WebEx in the search bar. Download [employer], the blue/green circle. The app is free but as with any other app downloads, their phone may require them to verify saved payment information or Android password. The patient does NOT have to create an account.   CONSENT FOR TELE-HEALTH VISIT - PLEASE REVIEW  I hereby voluntarily request, consent and authorize [facility] and its employed or contracted physicians, physician assistants, nurse practitioners or other licensed health care professionals (the Practitioner), to provide me with telemedicine health care services (the "Services") as deemed necessary by the treating Practitioner. I  acknowledge and consent to receive the Services by the Practitioner via telemedicine. I understand that the telemedicine visit will involve communicating with the Practitioner through live audiovisual communication technology and the disclosure of certain medical information by electronic transmission. I acknowledge that I have been given the opportunity to request an in-person assessment or other available alternative prior to the telemedicine visit and am  voluntarily participating in the telemedicine visit.  I understand that I have the right to withhold or withdraw my consent to the use of telemedicine in the course of my care at any time, without affecting my right to future care or treatment, and that the Practitioner or I may terminate the telemedicine visit at any time. I understand that I have the right to inspect all information obtained and/or recorded in the course of the telemedicine visit and may receive copies of available information for a reasonable fee.  I understand that some of the potential risks of receiving the Services via telemedicine include:  Marland Kitchen Delay or interruption in medical evaluation due to technological equipment failure or disruption; . Information transmitted may not be sufficient (e.g. poor resolution of images) to allow for appropriate medical decision making by the Practitioner; and/or  . In rare instances, security protocols could fail, causing a breach of personal health information.  Furthermore, I acknowledge that it is my responsibility to provide information about my medical history, conditions and care that is complete and accurate to the best of my ability. I acknowledge that Practitioner's advice, recommendations, and/or decision may be based on factors not within their control, such as incomplete or inaccurate data provided by me or distortions of diagnostic images or specimens that may result from electronic transmissions. I understand that the practice of medicine is not an exact science and that Practitioner makes no warranties or guarantees regarding treatment outcomes. I acknowledge that I will receive a copy of this consent concurrently upon execution via email to the email address I last provided but may also request a printed copy by calling the office of [place].    I understand that my insurance will be billed for this visit.   I have read or had this consent read to me. . I understand the  contents of this consent, which adequately explains the benefits and risks of the Services being provided via telemedicine.  . I have been provided ample opportunity to ask questions regarding this consent and the Services and have had my questions answered to my satisfaction. . I give my informed consent for the services to be provided through the use of telemedicine in my medical care  By participating in this telemedicine visit I agree to the above.

## 2019-04-04 NOTE — Telephone Encounter (Signed)
Left detailed message regarding AVS instructions and also stated that AVS available for review on mychart

## 2019-04-04 NOTE — Progress Notes (Signed)
Virtual Visit via Video Note   This visit type was conducted due to national recommendations for restrictions regarding the COVID-19 Pandemic (e.g. social distancing) in an effort to limit this patient's exposure and mitigate transmission in our community.  Due to his co-morbid illnesses, this patient is at least at moderate risk for complications without adequate follow up.  This format is felt to be most appropriate for this patient at this time.  All issues noted in this document were discussed and addressed.  A limited physical exam was performed with this format.  Please refer to the patient's chart for his consent to telehealth for [facility].   Evaluation Performed:  Follow-up visit  Date:  04/04/2019   ID:  Kyle Chavez, DOB September 04, 1966, MRN [id number]  Patient Location: Home  Provider Location: Home  PCP:  Street, Stephanie Couphristopher M, MD  Cardiologist:  Nanetta BattyJonathan Channon Ambrosini, MD  Electrophysiologist:  None   Chief Complaint: 1 year follow-up coronary artery disease  History of Present Illness:    Kyle Chavez is a 53 y.o. male who presents via audio/video conferencing for a telehealth visit today.    Kyle Chavez is a 53 y.o. male who I last saw him in the 01/01/2018.  He has a history of coronary artery disease with two-vessel PCI and stenting via the right radial approach in July of 2010 this was the circumflex artery in his right coronary artery. He did have moderately diffuse disease of the ramus branch of 70% which was treated medically. He does have obstructive sleep apnea on CPAP, he has hypertension, hyperlipidemia and a strong family history of heart disease. Recently his father had ICD placed. His father had an MI in his early 6140s, his paternal grandfather died of an MI at 55yo and his maternal grandfather died of an MI at 55yo.  He was catheterized by Dr. Herbie BaltimoreHarding on April 4th 2012 revealing patent stents and a diffusely diseased ramus branch, unchanged from  previous. He was then catheterized by Dr. Allyson SabalBerry on March 05 2012 which showed similar anatomy.  In June 2014 he underwent exercise nuclear study which was normal no ischemia and normal LV wall motion. 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  Patient reports that in December 2014, he started having chest pressure radiating to his left arm and jaw, lasting up to 30 minutes and recurring after nitroglycerin. This was prompted by exertion but did not fully subside at rest. [facility] where [facility] despite patient's request to be transferred [facility] where his cardiologist group is. Patient reportedly underwent cath which did not show anything new or acute. He was discharged home without any medication change.   He recently was [facility] on 02/22/16 through 02/23/16 with chest pain. He ruled out for myocardial infarction. A Myoview stress test performed 02/23/16 was nonischemic and a 2-D echo was normal. He was discharged home and has had no recurrent symptoms.  Since then, he reports having occasional chest pressure, lasting a few minutes and resolving on its own, not requiring nitro. Pain occurs with exertion, not associated with shortness of breath, palpitations, nausea, diaphoresis or radiation to the arm and jaw. These episodes occur a couple of times per week.  He reports some dyspnea with going up a flight of stairs. He denies any lower extremity swelling. No orthopnea.  He wears his CPAP machine intermittently when he doesn't forget  to wear it. He doesn't exercise. 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 He has never smoked. He is compliant with his medications.    He underwent cardiac catheterization by Dr. Katrinka Blazing 07/31/16 revealing a patent distal right and circumflex stent, 85% first diagonal branch stenosis and 50% proximal ramus branch stenosis with normal LV function.  Medical therapy was recommended. Since I saw him a year ago he's remained asymptomatic. He is being followed for pancreatic cyst at [facility].  Since I saw him in the office a year ago he is remained stable.  He remains on the current medications.  He no longer uses CPAP for his obstructive sleep apnea.  His last lipid profile was over a year ago and we will recheck.  He denies chest pain or shortness of breath.  The patient does not have symptoms concerning for COVID-19 infection (fever, chills, cough, or new shortness of breath).    Past Medical History:  Diagnosis Date  . Angina   . CAD (coronary artery disease)   . Dyslipidemia   . Kyle (erectile dysfunction)   . Family history of heart disease   . GERD (gastroesophageal reflux disease)   . History of nuclear stress test 04/18/2011   exercise myoview; normal pattern of perfusion in all regions; low risk scan   . Hypertension   . Migraine 03/05/12   "still have them every now and then"  . Myocardial infarction (HCC) 2010  . OSA on CPAP    Past Surgical History:  Procedure Laterality Date  . CARDIAC CATHETERIZATION  03/05/12   patent stents, diffusely disease ramus branch  . CARDIAC CATHETERIZATION N/A 07/31/2016   Procedure: Left Heart Cath and Coronary Angiography;  Surgeon: Lyn Records, MD;  Location: [facility] INVASIVE CV LAB;  Service: Cardiovascular;  Laterality: N/A;  . CHOLECYSTECTOMY  ?1990's  . CORONARY ANGIOPLASTY WITH STENT PLACEMENT  07/09/2009   PCI & stenting with Xience DES of circumflex & RCA  . LACERATION REPAIR  ~ 2009   right thumb  . LEFT HEART CATHETERIZATION WITH CORONARY ANGIOGRAM N/A 03/05/2012   Procedure: LEFT HEART CATHETERIZATION WITH CORONARY ANGIOGRAM;  Surgeon: Runell Gess, MD;  Location[facility] C[facility];  Service: Cardiovascular;  Laterality: N/A;  . Rt arm surgery  1960's   "caught in washing machine; have had 2 skin grafts there"  . TONSILLECTOMY AND ADENOIDECTOMY     "when I was  a kid"     Current Meds  Medication Sig  . aspirin 81 MG chewable tablet Chew 81 mg by mouth daily.  . carvedilol (COREG) 12.5 MG tablet TAKE 1 TABLET BY MOUTH TWICE (2) DAILY WITH A MEAL  . clopidogrel (PLAVIX) 75 MG tablet Take 1 tablet (75 mg total) by mouth daily. Please schedule appointment for refills.  Marland Kitchen EMGALITY 120 MG/ML SOAJ Inject 1 mL as directed every 30 (thirty) days.  . famotidine (PEPCID) 40 MG tablet Take 1 tablet (40 mg total) by mouth daily. Please call to schedule f/u with Dr Allyson Sabal for future refills. (1st attempt)  . fexofenadine (ALLEGRA) 180 MG tablet Take 180 mg by mouth daily.  Marland Kitchen gabapentin (NEURONTIN) 100 MG capsule Take 200 mg by mouth at bedtime.  . hydrochlorothiazide (MICROZIDE) 12.5 MG capsule Take 1 capsule (12.5 mg total) by mouth daily. MUST KEEP APPOINTMENT 03/14/19 WITH DR Shelonda Saxe FOR FUTURE REFILLS  . isosorbide mononitrate (IMDUR) 60 MG 24 hr tablet Take 1.5 tablets (90  mg total) by mouth daily. Please schedule appointment for refills.  Marland Kitchen losartan (COZAAR) 50 MG tablet TAKE 1 TABLET BY MOUTH ONCE (1) DAILY  . Multiple Vitamin (MULTIVITAMIN) tablet Take 1 tablet by mouth daily.  Marland Kitchen NIFEdipine (PROCARDIA-XL/NIFEDICAL-XL) 30 MG 24 hr tablet TAKE 1 TABLET (30 MG TOTAL) BY MOUTH DAILY.  . nitroGLYCERIN (NITROSTAT) 0.4 MG SL tablet Place 1 tablet (0.4 mg total) under the tongue every 5 (five) minutes as needed for chest pain.  Marland Kitchen ondansetron (ZOFRAN) 8 MG tablet Take 8 mg by mouth every 8 (eight) hours as needed for nausea or vomiting.  . pravastatin (PRAVACHOL) 20 MG tablet TAKE 1 TABLET BY MOUTH AT BEDTIME FOR CHOLESTEROL.  Marland Kitchen tamsulosin (FLOMAX) 0.4 MG CAPS capsule Take 0.4 mg by mouth daily.     Allergies:   Patient has no known allergies.   Social History   Tobacco Use  . Smoking status: Never Smoker  . Smokeless tobacco: Never Used  Substance Use Topics  . Alcohol use: No    Comment: "quit drinking ~ 2010"  . Drug use: No     Family Hx: The  patient's family history includes Cancer in his mother; Coronary artery disease in his brother and father; Healthy in his sister and sister; Heart attack in his maternal grandfather; Heart attack (age of onset: 99) in his brother; Heart attack (age of onset: 66) in his father; Heart disease in his paternal grandmother and another family member; Stroke in his maternal grandmother.  ROS:   Please see the history of present illness.     All other systems reviewed and are negative.   Prior CV studies:   The following studies were reviewed today:  None  Labs/Other Tests and Data Reviewed:    EKG:  No ECG reviewed.  Recent Labs: No results found for requested labs within last 8760 hours.   Recent Lipid Panel Lab Results  Component Value Date/Time   CHOL 138 01/01/2018 10:14 AM   TRIG 49 01/01/2018 10:14 AM   HDL 43 01/01/2018 10:14 AM   CHOLHDL 3.2 01/01/2018 10:14 AM   CHOLHDL 3.4 02/23/2016 02:35 AM   LDLCALC 85 01/01/2018 10:14 AM    Wt Readings from Last 3 Encounters:  01/01/18 170 lb (77.1 kg)  06/18/17 165 lb (74.8 kg)  03/16/17 166 lb 12.8 oz (75.7 kg)     Objective:    Vital Signs:  There were no vitals taken for this visit.   A full physical exam was not performed today since this was a telemedicine virtual video visit.  ASSESSMENT & PLAN:    1. Coronary artery disease- history of CAD status post circumflex and RCA stenting by myself performed radially July 2010.  He had multiple catheterizations performed since that time by myself, Dr. Herbie Baltimore and most recently by Dr. Katrinka Blazing 07/31/2016 revealing widely patent stents with some disease in his diagonal and ramus branches and normal LV function.  Medical therapy was recommended.  He denies chest pain or shortness of breath. 2. Dyslipidemia- on statin therapy.  Is been over a year since his last lipid and liver profile.  I will recheck this. 3. Essential hypertension- history of essential hypertension although he has not  checked his blood pressure in quite a while.  His medications include carvedilol, hydrochlorothiazide, losartan and Procardia.  COVID-19 Education: The signs and symptoms of COVID-19 were discussed with the patient and how to seek care for testing (follow up with PCP or arrange E-visit).  The importance of  social distancing was discussed today.  Time:   Today, I have spent 15 minutes with the patient with telehealth technology discussing the above problems.     Medication Adjustments/Labs and Tests Ordered: Current medicines are reviewed at length with the patient today.  Concerns regarding medicines are outlined above.  Tests Ordered: No orders of the defined types were placed in this encounter.  Medication Changes: No orders of the defined types were placed in this encounter.   Disposition:  Follow up in 1 year(s)  Signed, Nanetta Batty, MD  04/04/2019 8:56 AM    [facility]

## 2019-04-29 DIAGNOSIS — M545 Low back pain: Secondary | ICD-10-CM | POA: Diagnosis not present

## 2019-05-02 DIAGNOSIS — M5416 Radiculopathy, lumbar region: Secondary | ICD-10-CM | POA: Diagnosis not present

## 2019-05-06 DIAGNOSIS — M545 Low back pain: Secondary | ICD-10-CM | POA: Diagnosis not present

## 2019-05-16 DIAGNOSIS — M5416 Radiculopathy, lumbar region: Secondary | ICD-10-CM | POA: Diagnosis not present

## 2019-07-18 ENCOUNTER — Encounter: Payer: Self-pay | Admitting: Gastroenterology

## 2019-09-26 ENCOUNTER — Other Ambulatory Visit: Payer: Self-pay | Admitting: Cardiovascular Disease

## 2019-10-27 ENCOUNTER — Other Ambulatory Visit: Payer: Self-pay | Admitting: Cardiovascular Disease

## 2019-10-27 DIAGNOSIS — Z8249 Family history of ischemic heart disease and other diseases of the circulatory system: Secondary | ICD-10-CM

## 2019-12-15 ENCOUNTER — Other Ambulatory Visit: Payer: Self-pay

## 2019-12-15 MED ORDER — CLOPIDOGREL BISULFATE 75 MG PO TABS: 75.0000 mg | ORAL_TABLET | Freq: Every day | ORAL | 1 refills | Status: DC

## 2020-04-02 ENCOUNTER — Other Ambulatory Visit: Payer: Self-pay | Admitting: Cardiovascular Disease

## 2020-04-05 ENCOUNTER — Telehealth (INDEPENDENT_AMBULATORY_CARE_PROVIDER_SITE_OTHER): Payer: 59 | Admitting: Cardiology

## 2020-04-05 ENCOUNTER — Encounter: Payer: Self-pay | Admitting: Cardiology

## 2020-04-05 VITALS — Ht 66.0 in | Wt 170.0 lb

## 2020-04-05 DIAGNOSIS — E785 Hyperlipidemia, unspecified: Secondary | ICD-10-CM

## 2020-04-05 DIAGNOSIS — K862 Cyst of pancreas: Secondary | ICD-10-CM

## 2020-04-05 DIAGNOSIS — I2510 Atherosclerotic heart disease of native coronary artery without angina pectoris: Secondary | ICD-10-CM

## 2020-04-05 DIAGNOSIS — I10 Essential (primary) hypertension: Secondary | ICD-10-CM | POA: Diagnosis not present

## 2020-04-05 DIAGNOSIS — Z9861 Coronary angioplasty status: Secondary | ICD-10-CM

## 2020-04-05 NOTE — Patient Instructions (Signed)
Medication Instructions:  Your Physician recommend you continue on your current medication as directed.    *If you need a refill on your cardiac medications before your next appointment, please call your pharmacy*   Lab Work: None   Testing/Procedures: None   Follow-Up: At [facility], you and your health needs are our priority.  As part of our continuing mission to provide you with exceptional heart care, we have created designated Provider Care Teams.  These Care Teams include your primary Cardiologist (physician) and Advanced Practice Providers (APPs -  Physician Assistants and Nurse Practitioners) who all work together to provide you with the care you need, when you need it.  We recommend signing up for the patient portal called "MyChart".  Sign up information is provided on this After Visit Summary.  MyChart is used to connect with patients for Virtual Visits (Telemedicine).  Patients are able to view lab/test results, encounter notes, upcoming appointments, etc.  Non-urgent messages can be sent to your provider as well.   To learn more about what you can do with MyChart, go to [email or web address].    Your next appointment:   6-8 month(s)  The format for your next appointment:   In Person  Provider:   Nanetta Batty, MD

## 2020-04-05 NOTE — Progress Notes (Signed)
Virtual Visit via Video Note   This visit type was conducted due to national recommendations for restrictions regarding the COVID-19 Pandemic (e.g. social distancing) in an effort to limit this patient's exposure and mitigate transmission in our community.  Due to his co-morbid illnesses, this patient is at least at moderate risk for complications without adequate follow up.  This format is felt to be most appropriate for this patient at this time.  All issues noted in this document were discussed and addressed.  A limited physical exam was performed with this format.  Please refer to the patient's chart for his consent to telehealth for [facility].   The patient was identified using 2 identifiers.  Date:  04/05/2020   ID:  Kyle Chavez, Kyle Chavez 04-Jun-1966, MRN [id number]  Patient Location: Home Provider Location: Office  PCP:  Street, Sharon Mt, MD  Cardiologist:  Quay Burow, MD  Electrophysiologist:  None   Evaluation Performed:  Follow-Up Visit  Chief Complaint:  none  History of Present Illness:    Kyle Chavez is a 54 y.o. male with a history of CAD, HLD, OSA (no longer requiring C-pap) and HTN. He had CFX and RCA PCI and stenting viaright radial approach July 2010. He did have moderately diffuse disease of his ramus branch which was 70% and treated medically. He had a cath in 2014 at [facility] and he was told this was ok. He presented to the office in 07/28/16 again with chest pain and was admitted for elective cath 08/01/16. This showed patent stents, 50% RI, 85% Dx1. Plan was for medical Rx. He was admitted again to [facility] in March 2018 with chest pain felt to be non cardiac. Further evaluation revealed a pancreatic mass. He was referred to [facility]. This appears to be stable.  He was contacted today for routine follow up. His LOV was April 2020. He denies chest pain.  He has had no issues with his medications.  Dr Gwenlyn Found indicated previously the patient no longer required  C-pap.  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  The patient does not have symptoms concerning for COVID-19 infection (fever, chills, cough, or new shortness of breath).    Past Medical History:  Diagnosis Date  . Angina   . CAD (coronary artery disease)   . Dyslipidemia   . ED (erectile dysfunction)   . Family history of heart disease   . GERD (gastroesophageal reflux disease)   . History of nuclear stress test 04/18/2011   exercise myoview; normal pattern of perfusion in all regions; low risk scan   . Hypertension   . Migraine 03/05/12   "still have them every now and then"  . Myocardial infarction ([place]) 2010  . OSA on CPAP    Past Surgical History:  Procedure Laterality Date  . CARDIAC CATHETERIZATION  03/05/12   patent stents, diffusely disease ramus branch  . CARDIAC CATHETERIZATION N/A 07/31/2016   Procedure: Left Heart Cath and Coronary Angiography;  Surgeon: Belva Crome, MD;  Location: [place] [facility];  Service: Cardiovascular;  Laterality: N/A;  . CHOLECYSTECTOMY  ?1990's  . CORONARY ANGIOPLASTY WITH STENT PLACEMENT  07/09/2009   PCI & stenting with Xience DES of circumflex & RCA  . LACERATION REPAIR  ~ 2009   right thumb  . LEFT HEART CATHETERIZATION WITH CORONARY ANGIOGRAM N/A 03/05/2012   Procedure: LEFT HEART CATHETERIZATION WITH CORONARY ANGIOGRAM;  Surgeon: Lorretta Harp, MD;  Location: [facility] CATH LAB;  Service: Cardiovascular;  Laterality: N/A;  . Rt arm surgery  1960's   "caught in washing machine; have had 2 skin grafts there"  . TONSILLECTOMY AND ADENOIDECTOMY     "when I was a kid"     Current Meds  Medication Sig  . aspirin 81 MG chewable tablet Chew 81 mg by mouth daily.  . carvedilol (COREG) 12.5 MG tablet TAKE 1 TABLET BY MOUTH TWICE (2) DAILY WITH A MEAL  . clopidogrel (PLAVIX) 75 MG tablet Take 1 tablet (75 mg total) by mouth daily. Please schedule appointment for refills.  Marland Kitchen EMGALITY 120 MG/ML SOAJ Inject 1 mL as directed every 30  (thirty) days.  . famotidine (PEPCID) 40 MG tablet Take 1 tablet (40 mg total) by mouth daily. Please call to schedule f/u with Dr Allyson Sabal for future refills. (1st attempt)  . fexofenadine (ALLEGRA) 180 MG tablet Take 180 mg by mouth daily.  Marland Kitchen gabapentin (NEURONTIN) 300 MG capsule Take 300 mg by mouth 2 (two) times daily.   . hydrochlorothiazide (MICROZIDE) 12.5 MG capsule Take 1 capsule (12.5 mg total) by mouth daily.  . isosorbide mononitrate (IMDUR) 60 MG 24 hr tablet TAKE 1 AND 1/2 TABLETS BY MOUTH DAILY.  Marland Kitchen losartan (COZAAR) 50 MG tablet TAKE 1 TABLET BY MOUTH ONCE (1) DAILY  . Multiple Vitamin (MULTIVITAMIN) tablet Take 1 tablet by mouth daily.  Marland Kitchen NIFEdipine (PROCARDIA-XL/NIFEDICAL-XL) 30 MG 24 hr tablet TAKE 1 TABLET (30 MG TOTAL) BY MOUTH DAILY.  . nitroGLYCERIN (NITROSTAT) 0.4 MG SL tablet Place 1 tablet (0.4 mg total) under the tongue every 5 (five) minutes as needed for chest pain.  Marland Kitchen ondansetron (ZOFRAN) 8 MG tablet Take 8 mg by mouth every 8 (eight) hours as needed for nausea or vomiting.  . pravastatin (PRAVACHOL) 20 MG tablet TAKE 1 TABLET BY MOUTH AT BEDTIME FOR CHOLESTEROL.  Marland Kitchen tamsulosin (FLOMAX) 0.4 MG CAPS capsule Take 0.4 mg by mouth daily.     Allergies:   Patient has no known allergies.   Social History   Tobacco Use  . Smoking status: Never Smoker  . Smokeless tobacco: Never Used  Substance Use Topics  . Alcohol use: No    Comment: "quit drinking ~ 2010"  . Drug use: No     Family Hx: The patient's family history includes Cancer in his mother; Coronary artery disease in his brother and father; Healthy in his sister and sister; Heart attack in his maternal grandfather; Heart attack (age of onset: 15) in his brother; Heart attack (age of onset: 2) in his father; Heart disease in his paternal grandmother and another family member; Stroke in his maternal grandmother.  ROS:   Please see the history of present illness.    All other systems reviewed and are  negative.   Prior CV studies:   The following studies were reviewed today: Cath 2017  Labs/Other Tests and Data Reviewed:    EKG:  An ECG dated 01/01/2018 was personally reviewed today and demonstrated:  NSR- HR 76  Recent Labs: No results found for requested labs within last 8760 hours.   Recent Lipid Panel Lab Results  Component Value Date/Time   CHOL 138 01/01/2018 10:14 AM   TRIG 49 01/01/2018 10:14 AM   HDL 43 01/01/2018 10:14 AM   CHOLHDL 3.2 01/01/2018 10:14 AM   CHOLHDL 3.4 02/23/2016 02:35 AM   LDLCALC 85 01/01/2018 10:14 AM    Wt Readings from Last 3 Encounters:  04/05/20 170 lb (77.1 kg)  01/01/18 170 lb (77.1 kg)  06/18/17 165 lb (74.8 kg)     Objective:    Vital Signs:  Ht 5\' 6"  (1.676 m)   Wt 170 lb (77.1 kg)   BMI 27.44 kg/m    VITAL SIGNS:  reviewed  ASSESSMENT & PLAN:    CAD S/P PCI July 2010 RCA and CFX PCI in July 2010 C/P March 2017- Myoview low risk C/P Aug 2017- cath patent stents, 50% RI, 85% Dx1, normal LVF- medical Rx. Pt currently stable- no angina.  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  I will send him a letter stating that from a cardiac standpoint there is no reason he can't have a DOT permit.  F/U with Dr WAUKESHA [facility] is 6-8 months- he should have repeat lipids then  COVID-19 Education: The signs and symptoms of COVID-19 were discussed with the patient and how to seek care for testing (follow up with PCP or arrange E-visit).  The importance of social distancing was discussed today.  Time:   Today, I have spent 15 minutes with the patient with telehealth technology discussing the above problems.     Medication Adjustments/Labs and Tests Ordered: Current medicines are reviewed at length with the patient today.  Concerns regarding medicines are outlined above.   Tests Ordered: No orders of the defined types were  placed in this encounter.   Medication Changes: No orders of the defined types were placed in this encounter.   Follow Up:  In Person Dr Allyson Sabal 6 months  Signed, Allyson Sabal, PA-C  04/05/2020 10:18 AM    [facility]

## 2020-05-05 ENCOUNTER — Other Ambulatory Visit: Payer: Self-pay | Admitting: Cardiovascular Disease

## 2020-05-05 DIAGNOSIS — Z8249 Family history of ischemic heart disease and other diseases of the circulatory system: Secondary | ICD-10-CM

## 2020-05-06 NOTE — Telephone Encounter (Addendum)
Patient's pharmacy calling  - patient's medication was denied on 05/05/20  informed pharmacy  - patient can  Have 3 month supply of medications   carvedilol 12.5 mg b.i.d. hctz 12.5 mg  One tablet daily  Nifedipine ER 30 mg   Pravastatin 20 mg  Daily    informed pharmacy Patient has been seen this year -  Verbal refilled medication  For 3 months with a #90 day quantity

## 2020-06-02 ENCOUNTER — Other Ambulatory Visit: Payer: Self-pay | Admitting: Cardiovascular Disease

## 2020-06-02 DIAGNOSIS — Z8249 Family history of ischemic heart disease and other diseases of the circulatory system: Secondary | ICD-10-CM

## 2020-07-28 ENCOUNTER — Other Ambulatory Visit: Payer: Self-pay | Admitting: Cardiovascular Disease

## 2020-07-28 DIAGNOSIS — Z8249 Family history of ischemic heart disease and other diseases of the circulatory system: Secondary | ICD-10-CM

## 2020-08-25 ENCOUNTER — Other Ambulatory Visit: Payer: Self-pay | Admitting: Cardiovascular Disease

## 2020-10-08 ENCOUNTER — Ambulatory Visit: Payer: 59 | Admitting: Cardiovascular Disease

## 2020-10-08 ENCOUNTER — Encounter: Payer: Self-pay | Admitting: Cardiovascular Disease

## 2020-10-08 ENCOUNTER — Other Ambulatory Visit: Payer: Self-pay

## 2020-10-08 VITALS — BP 122/84 | HR 70 | Ht 66.0 in | Wt 181.0 lb

## 2020-10-08 DIAGNOSIS — E785 Hyperlipidemia, unspecified: Secondary | ICD-10-CM

## 2020-10-08 DIAGNOSIS — Z9861 Coronary angioplasty status: Secondary | ICD-10-CM | POA: Diagnosis not present

## 2020-10-08 DIAGNOSIS — I10 Essential (primary) hypertension: Secondary | ICD-10-CM | POA: Diagnosis not present

## 2020-10-08 DIAGNOSIS — I2510 Atherosclerotic heart disease of native coronary artery without angina pectoris: Secondary | ICD-10-CM

## 2020-10-08 NOTE — Progress Notes (Signed)
10/08/2020 Kyle Chavez   1966/05/30  [id number]  Primary Physician Street, Stephanie Coup, MD Primary Cardiologist: Runell Gess MD FACP, [place], [place], [place]  HPI:  Kyle Chavez is a 54 y.o.  [email or web address] spoke to on the phone for video telemedicine visit 04/04/2019.  Hehas a history of coronary artery disease with two-vessel PCI and stenting via the right radial approach in July of 2010 this was the circumflex artery in his right coronary artery. He did have moderately diffuse disease of the ramus branch of 70% which was treated medically. He does have obstructive sleep apnea on CPAP, he has hypertension, hyperlipidemia and a strong family history of heart disease. Recently his father had ICD placed. His father had an MI in his early 41s, his paternal grandfather died of an MI at 70yo and his maternal grandfather died of an MI at 49yo.  He was catheterized by Dr. Herbie Baltimore on April 4th 2012 revealing patent stents and a diffusely diseased ramus branch, unchanged from previous. He was then catheterized by Dr. Allyson Sabal on March 05 2012 which showed similar anatomy.  In June 2014 he underwent exercise nuclear study which was normal no ischemia and normal LV wall motion. 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  Patient reports that in December 2014, he started having chest pressure radiating to his left arm and jaw, lasting up to 30 minutes and recurring after nitroglycerin. This was prompted by exertion but did not fully subside at rest. He went to [facility] where he was transferred to [facility] for cardiac cath despite patient's request to be transferred to [facility] where his cardiologist group is. Patient reportedly underwent cath which did not show anything new or acute. He was discharged home without any medication change.   He recently was admitted to [facility] on 02/22/16 through 02/23/16 with chest pain. He ruled out for myocardial infarction. A  Myoview stress test performed 02/23/16 was nonischemic and a 2-D echo was normal. He was discharged home and has had no recurrent symptoms.  Since then, he reports having occasional chest pressure, lasting a few minutes and resolving on its own, not requiring nitro. Pain occurs with exertion, not associated with shortness of breath, palpitations, nausea, diaphoresis or radiation to the arm and jaw. These episodes occur a couple of times per week.  He reports some dyspnea with going up a flight of stairs. He denies any lower extremity swelling. No orthopnea.  He wears his CPAP machine intermittently when he doesn't forget to wear it. He doesn't exercise. 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 He has never smoked. He is compliant with his medications.   He underwent cardiac catheterization by Dr. Katrinka Blazing 8/7/17revealing a patent distal right and circumflex stent, 85% first diagonal branch stenosis and 50% proximal ramus branch stenosis with normal LV function. Medical therapy was recommended.Since I saw him a year ago he's remained asymptomatic. He is being followed for pancreatic cyst at [facility].  Since I spoke to him on the phone a year ago he continues to do well.  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  He gets occasional atypical left-sided chest pain.  Current Meds  Medication Sig  . aspirin 81 MG chewable tablet Chew 81 mg by mouth daily.  . carvedilol (COREG) 12.5 MG tablet TAKE 1 TABLET BY MOUTH TWICE (2) DAILY  . clopidogrel (PLAVIX)  75 MG tablet TAKE ONE TABLET BY MOUTH ONCE DAILY  . EMGALITY 120 MG/ML SOAJ Inject 1 mL as directed every 30 (thirty) days.  . famotidine (PEPCID) 40 MG tablet Take 1 tablet (40 mg total) by mouth daily. Please call to schedule f/u with Dr Allyson Sabal for future refills. (1st attempt)  . fexofenadine (ALLEGRA) 180 MG tablet Take 180 mg by mouth daily.  Marland Kitchen gabapentin (NEURONTIN) 300 MG  capsule Take 300 mg by mouth 2 (two) times daily.   . hydrochlorothiazide (MICROZIDE) 12.5 MG capsule TAKE 1 CAPSULE BY MOUTH DAILY  . isosorbide mononitrate (IMDUR) 60 MG 24 hr tablet TAKE 1 AND 1/2 TABLETS BY MOUTH DAILY.  Marland Kitchen losartan (COZAAR) 50 MG tablet TAKE 1 TABLET BY MOUTH ONCE (1) DAILY  . Multiple Vitamin (MULTIVITAMIN) tablet Take 1 tablet by mouth daily.  Marland Kitchen NIFEdipine (PROCARDIA-XL/NIFEDICAL-XL) 30 MG 24 hr tablet TAKE 1 TABLET BY MOUTH DAILY  . nitroGLYCERIN (NITROSTAT) 0.4 MG SL tablet Place 1 tablet (0.4 mg total) under the tongue every 5 (five) minutes as needed for chest pain.  Marland Kitchen ondansetron (ZOFRAN) 8 MG tablet Take 8 mg by mouth every 8 (eight) hours as needed for nausea or vomiting.  . pravastatin (PRAVACHOL) 20 MG tablet TAKE 1 TABLET BY MOUTH AT BEDTIME FOR CHOLESTEROL.  Marland Kitchen tamsulosin (FLOMAX) 0.4 MG CAPS capsule Take 0.4 mg by mouth daily.     No Known Allergies  Social History   Socioeconomic History  . Marital status: Married    Spouse name: Not on file  . Number of children: 1  . Years of education: Not on file  . Highest education level: Not on file  Occupational History  . Occupation: Truck Hospital doctor    Comment: drives for [employer]: [employer]  Tobacco Use  . Smoking status: Never Smoker  . Smokeless tobacco: Never Used  Vaping Use  . Vaping Use: Never used  Substance and Sexual Activity  . Alcohol use: No    Comment: "quit drinking ~ 2010"  . Drug use: No  . Sexual activity: Not Currently  Other Topics Concern  . Not on file  Social History Narrative      Pt lives at home with his spouse.   Caffeine Use- Drinks soda daily   Social Determinants of Health   Financial Resource Strain:   . Difficulty of Paying Living Expenses: Not on file  Food Insecurity:   . Worried About Programme researcher, broadcasting/film/video in the Last Year: Not on file  . Ran Out of Food in the Last Year: Not on file  Transportation Needs:   . Lack of Transportation (Medical): Not on  file  . Lack of Transportation (Non-Medical): Not on file  Physical Activity:   . Days of Exercise per Week: Not on file  . Minutes of Exercise per Session: Not on file  Stress:   . Feeling of Stress : Not on file  Social Connections:   . Frequency of Communication with Friends and Family: Not on file  . Frequency of Social Gatherings with Friends and Family: Not on file  . Attends Religious Services: Not on file  . Active Member of Clubs or Organizations: Not on file  . Attends Banker Meetings: Not on file  . Marital Status: Not on file  Intimate Partner Violence:   . Fear of Current or Ex-Partner: Not on file  . Emotionally Abused: Not on file  . Physically Abused: Not on file  . Sexually  Abused: Not on file     Review of Systems: General: negative for chills, fever, night sweats or weight changes.  Cardiovascular: negative for chest pain, dyspnea on exertion, edema, orthopnea, palpitations, paroxysmal nocturnal dyspnea or shortness of breath Dermatological: negative for rash Respiratory: negative for cough or wheezing Urologic: negative for hematuria Abdominal: negative for nausea, vomiting, diarrhea, bright red blood per rectum, melena, or hematemesis Neurologic: negative for visual changes, syncope, or dizziness All other systems reviewed and are otherwise negative except as noted above.    Blood pressure 122/84, pulse 70, height 5\' 6"  (1.676 m), weight 181 lb (82.1 kg).  General appearance: alert and no distress Neck: no adenopathy, no carotid bruit, no JVD, supple, symmetrical, trachea midline and thyroid not enlarged, symmetric, no tenderness/mass/nodules Lungs: clear to auscultation bilaterally Heart: regular rate and rhythm, S1, S2 normal, no murmur, click, rub or gallop Extremities: extremities normal, atraumatic, no cyanosis or edema Pulses: 2+ and symmetric Skin: Skin color, texture, turgor normal. No rashes or lesions Neurologic: Alert and oriented  X 3, normal strength and tone. Normal symmetric reflexes. Normal coordination and gait  EKG sinus rhythm at 70 with poor R wave progression.  I personally reviewed this EKG.  ASSESSMENT AND PLAN:   Essential hypertension History of essential hypertension a blood pressure measured today 122/84.  He is on carvedilol, hydrochlorothiazide, and losartan  CAD S/P PCI July 2010 History of CAD status post right radial cath July 2010 with PCI and stenting of his circumflex and RCA.  He had diffuse disease in his ramus branch treated medically.  He was catheterized by Dr. August 2010 March 29, 2011 revealing patent stents.  His last cath performed by Dr. March 31, 2011 07/31/2016 revealed a patent distal right and circumflex stent with 85% first diagonal branch stenosis 50% proximal ramus branch stenosis.  Medical therapy was recommended.  He gets occasional atypical chest pain which does not sound anginal.  Dyslipidemia History of dyslipidemia on statin therapy.  We will recheck a lipid liver profile.      09/30/2016 MD FACP,FACC,FAHA, [facility] 10/08/2020 11:32 AM

## 2020-10-08 NOTE — Assessment & Plan Note (Signed)
History of essential hypertension a blood pressure measured today 122/84.  He is on carvedilol, hydrochlorothiazide, and losartan

## 2020-10-08 NOTE — Assessment & Plan Note (Signed)
History of CAD status post right radial cath July 2010 with PCI and stenting of his circumflex and RCA.  He had diffuse disease in his ramus branch treated medically.  He was catheterized by Dr. Herbie Baltimore March 29, 2011 revealing patent stents.  His last cath performed by Dr. Katrinka Blazing 07/31/2016 revealed a patent distal right and circumflex stent with 85% first diagonal branch stenosis 50% proximal ramus branch stenosis.  Medical therapy was recommended.  He gets occasional atypical chest pain which does not sound anginal.

## 2020-10-08 NOTE — Assessment & Plan Note (Signed)
History of dyslipidemia on statin therapy.  We will recheck a lipid liver profile. 

## 2020-10-08 NOTE — Patient Instructions (Signed)
Medication Instructions:  No changes *If you need a refill on your cardiac medications before your next appointment, please call your pharmacy*   Lab Work: Your provider would like for you to return next week to have the following labs drawn: FASTING lipid and liver. You do not need an appointment for the lab. Once in our office lobby there is a podium where you can sign in and ring the doorbell to alert [place] that you are here. The lab is open from 8:00 am to 4:30 pm; closed for lunch from 12:45pm-1:45pm.  If you have labs (blood work) drawn today and your tests are completely normal, you will receive your results only by: Marland Kitchen MyChart Message (if you have MyChart) OR . A paper copy in the mail If you have any lab test that is abnormal or we need to change your treatment, we will call you to review the results.   Testing/Procedures: None ordered   Follow-Up: At [facility], you and your health needs are our priority.  As part of our continuing mission to provide you with exceptional heart care, we have created designated Provider Care Teams.  These Care Teams include your primary Cardiologist (physician) and Advanced Practice Providers (APPs -  Physician Assistants and Nurse Practitioners) who all work together to provide you with the care you need, when you need it.  We recommend signing up for the patient portal called "MyChart".  Sign up information is provided on this After Visit Summary.  MyChart is used to connect with patients for Virtual Visits (Telemedicine).  Patients are able to view lab/test results, encounter notes, upcoming appointments, etc.  Non-urgent messages can be sent to your provider as well.   To learn more about what you can do with MyChart, go to [email or web address].    Your next appointment:   12 month(s)  The format for your next appointment:   In Person  Provider:   You may see Nanetta Batty, MD or one of the following Advanced Practice Providers on your  designated Care Team:    Corine Shelter, PA-C  [place], [place]  Edd Fabian, [place]

## 2020-10-16 LAB — HEPATIC FUNCTION PANEL
ALT: 30 IU/L (ref 0–44)
AST: 22 IU/L (ref 0–40)
Albumin: 4.3 g/dL (ref 3.8–4.9)
Alkaline Phosphatase: 69 IU/L (ref 44–121)
Bilirubin Total: 1 mg/dL (ref 0.0–1.2)
Bilirubin, Direct: 0.26 mg/dL (ref 0.00–0.40)
Total Protein: 6.4 g/dL (ref 6.0–8.5)

## 2020-10-16 LAB — LIPID PANEL
Chol/HDL Ratio: 3.1 ratio (ref 0.0–5.0)
Cholesterol, Total: 141 mg/dL (ref 100–199)
HDL: 45 mg/dL (ref >39)
LDL Chol Calc (NIH): 84 mg/dL (ref 0–99)
Triglycerides: 59 mg/dL (ref 0–149)
VLDL Cholesterol Cal: 12 mg/dL (ref 5–40)

## 2021-01-06 ENCOUNTER — Other Ambulatory Visit: Payer: Self-pay | Admitting: Cardiovascular Disease

## 2021-02-07 ENCOUNTER — Other Ambulatory Visit: Payer: Self-pay | Admitting: Cardiovascular Disease

## 2021-03-29 ENCOUNTER — Other Ambulatory Visit: Payer: Self-pay | Admitting: Cardiovascular Disease

## 2021-04-01 ENCOUNTER — Other Ambulatory Visit: Payer: Self-pay | Admitting: Cardiovascular Disease

## 2021-04-25 ENCOUNTER — Other Ambulatory Visit: Payer: Self-pay | Admitting: Cardiovascular Disease

## 2021-08-16 ENCOUNTER — Other Ambulatory Visit: Payer: Self-pay | Admitting: Cardiovascular Disease

## 2021-08-16 DIAGNOSIS — Z8249 Family history of ischemic heart disease and other diseases of the circulatory system: Secondary | ICD-10-CM

## 2021-08-19 ENCOUNTER — Telehealth: Payer: Self-pay | Admitting: Cardiovascular Disease

## 2021-08-19 NOTE — Telephone Encounter (Signed)
Pt updated with MD's recommendations and verbalized understanding.Appointment scheduled for 09/30/21.   Runell Gess, MD  You 1 hour ago (3:08 PM)   Pt has nl LV Fxn. Can sched a ROV in Oct

## 2021-08-19 NOTE — Telephone Encounter (Signed)
Pt c/o swelling: STAT is pt has developed SOB within 24 hours  If swelling, where is the swelling located? Both legs, from his knee down to his feet  How much weight have you gained and in what time span?   Have you gained 3 pounds in a day or 5 pounds in a week?   Do you have a log of your daily weights (if so, list)? yes  Are you currently taking a fluid pill? yes  Are you currently SOB? no  Have you traveled recently? No- patient would like to be seen asap

## 2021-08-19 NOTE — Telephone Encounter (Signed)
Spoke with pt. He report for the past 4-5 weeks he's been experiencing off and on bilateral leg swelling. He state it will go away for about 2 days then return. Currently he report swelling is up to his knees. He denies changes in weight and diet but does report some SOB when he bend over to pick up something.  Will forward to MD for recommendations

## 2021-09-30 ENCOUNTER — Ambulatory Visit: Payer: 59 | Admitting: Cardiovascular Disease

## 2021-12-30 ENCOUNTER — Encounter: Payer: Self-pay | Admitting: Cardiovascular Disease

## 2021-12-30 ENCOUNTER — Other Ambulatory Visit: Payer: Self-pay

## 2021-12-30 ENCOUNTER — Ambulatory Visit: Payer: 59 | Admitting: Cardiovascular Disease

## 2021-12-30 VITALS — BP 120/74 | HR 67 | Ht 66.0 in | Wt 196.0 lb

## 2021-12-30 DIAGNOSIS — I2510 Atherosclerotic heart disease of native coronary artery without angina pectoris: Secondary | ICD-10-CM | POA: Diagnosis not present

## 2021-12-30 DIAGNOSIS — I10 Essential (primary) hypertension: Secondary | ICD-10-CM | POA: Diagnosis not present

## 2021-12-30 DIAGNOSIS — E785 Hyperlipidemia, unspecified: Secondary | ICD-10-CM

## 2021-12-30 DIAGNOSIS — Z9861 Coronary angioplasty status: Secondary | ICD-10-CM

## 2021-12-30 DIAGNOSIS — R600 Localized edema: Secondary | ICD-10-CM

## 2021-12-30 NOTE — Progress Notes (Signed)
12/30/2021 Kyle Chavez   [birth year]  [id number]  Primary Physician Street, Sharon [place], MD Primary Cardiologist: Lorretta Harp MD FACP, [place], [place], [place]  HPI:  Kyle Chavez is a 56 y.o.  male who I last saw in the office 10/08/2020.He has a history of coronary artery disease with two-vessel PCI and stenting via the right radial approach in July of 2010 this was the circumflex artery in his right coronary artery. He did have moderately diffuse disease of the ramus branch of 70% which was treated medically. He does have obstructive sleep apnea on CPAP, he has hypertension, hyperlipidemia and a strong family history of heart disease. Recently his father had ICD placed. His father had an MI in his early 36s, his paternal grandfather died of an MI at 45yo and his maternal grandfather died of an MI at 51yo.   He was catheterized by Dr. Ellyn Hack on April 4th 2012 revealing patent stents and a diffusely diseased ramus branch, unchanged from previous. He was then catheterized by Dr. Gwenlyn Found on March 05 2012 which showed similar anatomy.   In June 2014 he underwent exercise nuclear study which was normal no ischemia and normal LV wall motion. 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   Patient reports that in December 2014, he started having chest pressure radiating to his left arm and jaw, lasting up to 30 minutes and recurring after nitroglycerin. This was prompted by exertion but did not fully subside at rest. He went to [facility] where he was transferred to [facility] for cardiac cath despite patient's request to be transferred to [facility] where his cardiologist group is. Patient reportedly underwent cath which did not show anything new or acute. He was discharged home without any medication change.    He recently was admitted to [facility] on 02/22/16 through 02/23/16 with chest pain. He ruled out for myocardial infarction. A Myoview stress test performed 02/23/16 was  nonischemic and a 2-D echo was normal. He was discharged home and has had no recurrent symptoms.   Since then, he reports having occasional chest pressure, lasting a few minutes and resolving on its own, not requiring nitro. Pain occurs with exertion, not associated with shortness of breath, palpitations, nausea, diaphoresis or radiation to the arm and jaw. These episodes occur a couple of times per week.   He reports some dyspnea with going up a flight of stairs. He denies any lower extremity swelling. No orthopnea.   He wears his CPAP machine intermittently when he doesn't forget to wear it. He doesn't exercise. 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 He has never smoked. He is compliant with his medications.      He underwent cardiac catheterization by Dr. Tamala Julian 07/31/16 revealing a patent distal right and circumflex stent, 85% first diagonal branch stenosis and 50% proximal ramus branch stenosis with normal LV function. Medical therapy was recommended. Since I saw him a year ago he's remained asymptomatic. He is being followed for pancreatic cyst at [facility].   Since I I saw him a year ago he gets occasional chest pain which is not changed in frequency or severity.  He has gained 16 pounds.  He has a slight lower extremity edema on diuretics.   Current Meds  Medication Sig   aspirin 81 MG chewable tablet Chew 81 mg by mouth daily.   carvedilol (COREG) 12.5 MG  tablet TAKE 1 TABLET BY MOUTH TWICE (2) DAILY   clopidogrel (PLAVIX) 75 MG tablet TAKE ONE TABLET BY MOUTH ONCE DAILY   EMGALITY 120 MG/ML SOAJ Inject 1 mL as directed every 30 (thirty) days.   famotidine (PEPCID) 40 MG tablet Take 1 tablet (40 mg total) by mouth daily. Please call to schedule f/u with Dr Gwenlyn Found for future refills. (1st attempt)   fexofenadine (ALLEGRA) 180 MG tablet Take 180 mg by mouth daily.   gabapentin (NEURONTIN) 300 MG capsule Take 300 mg by mouth 2 (two) times  daily.    hydrochlorothiazide (MICROZIDE) 12.5 MG capsule TAKE 1 CAPSULE BY MOUTH DAILY   isosorbide mononitrate (IMDUR) 60 MG 24 hr tablet TAKE 1 AND 1/2 TABLET BY MOUTH DAILY   losartan (COZAAR) 50 MG tablet TAKE 1 TABLET BY MOUTH ONCE (1) DAILY   Multiple Vitamin (MULTIVITAMIN) tablet Take 1 tablet by mouth daily.   NIFEdipine (PROCARDIA-XL/NIFEDICAL-XL) 30 MG 24 hr tablet TAKE 1 TABLET BY MOUTH DAILY   nitroGLYCERIN (NITROSTAT) 0.4 MG SL tablet Place 1 tablet (0.4 mg total) under the tongue every 5 (five) minutes as needed for chest pain.   ondansetron (ZOFRAN) 8 MG tablet Take 8 mg by mouth every 8 (eight) hours as needed for nausea or vomiting.   pravastatin (PRAVACHOL) 20 MG tablet TAKE 1 TABLET BY MOUTH AT BEDTIME FOR CHOLESTEROL.   tamsulosin (FLOMAX) 0.4 MG CAPS capsule Take 0.4 mg by mouth daily.     No Known Allergies  Social History   Socioeconomic History   Marital status: Married    Spouse name: Not on file   Number of children: 1   Years of education: Not on file   Highest education level: Not on file  Occupational History   Occupation: Truck driver    Comment: drives for [employer]    Employer: [employer]  Tobacco Use   Smoking status: Never   Smokeless tobacco: Never  Vaping Use   Vaping Use: Never used  Substance and Sexual Activity   Alcohol use: No    Comment: "quit drinking ~ 2010"   Drug use: No   Sexual activity: Not Currently  Other Topics Concern   Not on file  Social History Narrative      Pt lives at home with his spouse.   Caffeine Use- Drinks soda daily   Social Determinants of Health   Financial Resource Strain: Not on file  Food Insecurity: Not on file  Transportation Needs: Not on file  Physical Activity: Not on file  Stress: Not on file  Social Connections: Not on file  Intimate Partner Violence: Not on file     Review of Systems: General: negative for chills, fever, night sweats or weight changes.  Cardiovascular: negative for chest  pain, dyspnea on exertion, edema, orthopnea, palpitations, paroxysmal nocturnal dyspnea or shortness of breath Dermatological: negative for rash Respiratory: negative for cough or wheezing Urologic: negative for hematuria Abdominal: negative for nausea, vomiting, diarrhea, bright red blood per rectum, melena, or hematemesis Neurologic: negative for visual changes, syncope, or dizziness All other systems reviewed and are otherwise negative except as noted above.    Blood pressure 120/74, pulse 67, height 5\' 6"  (1.676 m), weight 196 lb (88.9 kg).  General appearance: alert and no distress Neck: no adenopathy, no carotid bruit, no JVD, supple, symmetrical, trachea midline, and thyroid not enlarged, symmetric, no tenderness/mass/nodules Lungs: clear to auscultation bilaterally Heart: regular rate and rhythm, S1, S2 normal, no murmur, click, rub or gallop Extremities:  Trace lower extremity edema Pulses: 2+ and symmetric Skin: Skin color, texture, turgor normal. No rashes or lesions Neurologic: Grossly normal  EKG sinus rhythm at 67 without ST or T wave changes.  I personally reviewed this EKG.  ASSESSMENT AND PLAN:   Essential hypertension History of essential hypertension a blood pressure measured today 120/74.  He is on carvedilol, hydrochlorothiazide and losartan.  CAD S/P PCI July 2010 History of CAD status post stenting of his RCA and circumflex by myself performed radially July 2010.  He did have moderate disease in his ramus branch in the 70% range.  His last catheterization was performed by Dr. Tamala Julian 07/31/2016 with unchanged anatomy.  Medical therapy was recommended.  He gets occasional chest pain which has not changed in frequency or severity.  Dyslipidemia History of hyperlipidemia on pravastatin 20 mg a day with lipid profile performed 10/15/2020 revealing total cholesterol 141, LDL of 84 and HDL 45.  I am going to increase him to 40 mg a day and we will recheck a lipid liver  profile in 3 months.  Bilateral lower extremity edema History of mild lower extremity edema on hydrochlorothiazide.  He does try to avoid salt.     Lorretta Harp MD FACP,FACC,FAHA, [facility] 12/30/2021 9:21 AM

## 2021-12-30 NOTE — Assessment & Plan Note (Signed)
History of mild lower extremity edema on hydrochlorothiazide.  He does try to avoid salt.

## 2021-12-30 NOTE — Patient Instructions (Signed)
Medication Instructions:  Your physician recommends that you continue on your current medications as directed. Please refer to the Current Medication list given to you today.  *If you need a refill on your cardiac medications before your next appointment, please call your pharmacy*   Lab Work: Your physician recommends that you return for lab work in: in the next week or so for FASTING lipid/liver profile.  If you have labs (blood work) drawn today and your tests are completely normal, you will receive your results only by: MyChart Message (if you have MyChart) OR A paper copy in the mail If you have any lab test that is abnormal or we need to change your treatment, we will call you to review the results.   Follow-Up: At [facility], you and your health needs are our priority.  As part of our continuing mission to provide you with exceptional heart care, we have created designated Provider Care Teams.  These Care Teams include your primary Cardiologist (physician) and Advanced Practice Providers (APPs -  Physician Assistants and Nurse Practitioners) who all work together to provide you with the care you need, when you need it.  We recommend signing up for the patient portal called "MyChart".  Sign up information is provided on this After Visit Summary.  MyChart is used to connect with patients for Virtual Visits (Telemedicine).  Patients are able to view lab/test results, encounter notes, upcoming appointments, etc.  Non-urgent messages can be sent to your provider as well.   To learn more about what you can do with MyChart, go to [email or web address].    Your next appointment:   6 month(s)  The format for your next appointment:   In Person  Provider:   Edd Fabian, FNP, Micah Flesher, PA-C, Marjie Skiff, PA-C, Juanda Crumble, PA-C, Joni Reining, DNP, ANP, or Azalee Course, PA-C       Then, Nanetta Batty, MD will plan to see you again in 12 month(s).

## 2021-12-30 NOTE — Assessment & Plan Note (Signed)
History of CAD status post stenting of his RCA and circumflex by myself performed radially July 2010.  He did have moderate disease in his ramus branch in the 70% range.  His last catheterization was performed by Dr. Katrinka Blazing 07/31/2016 with unchanged anatomy.  Medical therapy was recommended.  He gets occasional chest pain which has not changed in frequency or severity.

## 2021-12-30 NOTE — Assessment & Plan Note (Signed)
History of hyperlipidemia on pravastatin 20 mg a day with lipid profile performed 10/15/2020 revealing total cholesterol 141, LDL of 84 and HDL 45.  I am going to increase him to 40 mg a day and we will recheck a lipid liver profile in 3 months.

## 2021-12-30 NOTE — Assessment & Plan Note (Signed)
History of essential hypertension a blood pressure measured today 120/74.  He is on carvedilol, hydrochlorothiazide and losartan.

## 2021-12-31 LAB — LIPID PANEL
Chol/HDL Ratio: 3.5 ratio (ref 0.0–5.0)
Cholesterol, Total: 150 mg/dL (ref 100–199)
HDL: 43 mg/dL (ref >39)
LDL Chol Calc (NIH): 90 mg/dL (ref 0–99)
Triglycerides: 91 mg/dL (ref 0–149)
VLDL Cholesterol Cal: 17 mg/dL (ref 5–40)

## 2021-12-31 LAB — HEPATIC FUNCTION PANEL
ALT: 23 IU/L (ref 0–44)
AST: 12 IU/L (ref 0–40)
Albumin: 4.6 g/dL (ref 3.8–4.9)
Alkaline Phosphatase: 67 IU/L (ref 44–121)
Bilirubin Total: 0.9 mg/dL (ref 0.0–1.2)
Bilirubin, Direct: 0.21 mg/dL (ref 0.00–0.40)
Total Protein: 6.9 g/dL (ref 6.0–8.5)

## 2022-01-10 ENCOUNTER — Other Ambulatory Visit: Payer: Self-pay | Admitting: Cardiovascular Disease

## 2022-01-13 ENCOUNTER — Other Ambulatory Visit: Payer: Self-pay

## 2022-01-13 MED ORDER — NITROGLYCERIN 0.4 MG SL SUBL: 0.4000 mg | SUBLINGUAL_TABLET | SUBLINGUAL | 5 refills | Status: DC | PRN

## 2022-01-19 ENCOUNTER — Telehealth: Payer: Self-pay | Admitting: Cardiovascular Disease

## 2022-01-19 DIAGNOSIS — E785 Hyperlipidemia, unspecified: Secondary | ICD-10-CM

## 2022-01-19 MED ORDER — ATORVASTATIN CALCIUM 40 MG PO TABS: 40.0000 mg | ORAL_TABLET | Freq: Every day | ORAL | 3 refills | Status: DC

## 2022-01-19 NOTE — Telephone Encounter (Signed)
    Pt is returning call to get lab results 

## 2022-01-19 NOTE — Telephone Encounter (Signed)
Lorretta Harp, MD  12/31/2021 11:21 AM EST     LDL 90. Not at goal for secondary prevention (LDL<70) on Prava 20. Change to atorva 40 and re check 3 months  Pt notified of test results and the need to have labs done in 3 months. Orders placed.

## 2022-02-10 ENCOUNTER — Other Ambulatory Visit: Payer: Self-pay | Admitting: Cardiovascular Disease

## 2022-04-11 LAB — HEPATIC FUNCTION PANEL
ALT: 36 IU/L (ref 0–44)
AST: 22 IU/L (ref 0–40)
Albumin: 4.4 g/dL (ref 3.8–4.9)
Alkaline Phosphatase: 86 IU/L (ref 44–121)
Bilirubin Total: 1.4 mg/dL — ABNORMAL HIGH (ref 0.0–1.2)
Bilirubin, Direct: 0.33 mg/dL (ref 0.00–0.40)
Total Protein: 6.8 g/dL (ref 6.0–8.5)

## 2022-04-11 LAB — LIPID PANEL
Chol/HDL Ratio: 3 ratio (ref 0.0–5.0)
Cholesterol, Total: 110 mg/dL (ref 100–199)
HDL: 37 mg/dL — ABNORMAL LOW (ref >39)
LDL Chol Calc (NIH): 56 mg/dL (ref 0–99)
Triglycerides: 83 mg/dL (ref 0–149)
VLDL Cholesterol Cal: 17 mg/dL (ref 5–40)

## 2022-04-17 ENCOUNTER — Other Ambulatory Visit: Payer: Self-pay | Admitting: Cardiovascular Disease

## 2022-05-03 ENCOUNTER — Telehealth: Payer: Self-pay | Admitting: Cardiovascular Disease

## 2022-05-03 NOTE — Telephone Encounter (Signed)
Kyle Harp, MD  ?04/11/2022  6:44 AM EDT   ?  ?Excellent FLP and LFTs   ? ? ?Called patient back with lab results from Dr. Gwenlyn Found. Informed patient to continue his cholesterol medication. Patient verbalized understanding. ?

## 2022-05-03 NOTE — Telephone Encounter (Signed)
New Message: ? ? ? ? ?Patient would like to have his lab results from about 3 weeks ago please. ?

## 2022-05-15 ENCOUNTER — Other Ambulatory Visit: Payer: Self-pay | Admitting: Cardiovascular Disease

## 2022-09-04 ENCOUNTER — Ambulatory Visit: Payer: 59 | Admitting: General Practice

## 2022-09-14 ENCOUNTER — Encounter: Payer: Self-pay | Admitting: Physician Assistant

## 2022-09-14 ENCOUNTER — Ambulatory Visit: Payer: 59 | Attending: General Practice | Admitting: Physician Assistant

## 2022-09-14 VITALS — BP 120/80 | HR 72 | Ht 66.0 in | Wt 191.0 lb

## 2022-09-14 DIAGNOSIS — I2510 Atherosclerotic heart disease of native coronary artery without angina pectoris: Secondary | ICD-10-CM

## 2022-09-14 DIAGNOSIS — I10 Essential (primary) hypertension: Secondary | ICD-10-CM | POA: Diagnosis not present

## 2022-09-14 DIAGNOSIS — E785 Hyperlipidemia, unspecified: Secondary | ICD-10-CM | POA: Diagnosis not present

## 2022-09-14 NOTE — Patient Instructions (Signed)
Medication Instructions:  Your physician recommends that you continue on your current medications as directed. Please refer to the Current Medication list given to you today.  *If you need a refill on your cardiac medications before your next appointment, please call your pharmacy*  Lab Work: NONE ordered at this time of appointment   If you have labs (blood work) drawn today and your tests are completely normal, you will receive your results only by: MyChart Message (if you have MyChart) OR A paper copy in the mail If you have any lab test that is abnormal or we need to change your treatment, we will call you to review the results.  Testing/Procedures: NONE ordered at this time of appointment   Follow-Up: At [facility], you and your health needs are our priority.  As part of our continuing mission to provide you with exceptional heart care, we have created designated Provider Care Teams.  These Care Teams include your primary Cardiologist (physician) and Advanced Practice Providers (APPs -  Physician Assistants and Nurse Practitioners) who all work together to provide you with the care you need, when you need it.  Your next appointment:   6 month(s)  The format for your next appointment:   In Person  Provider:   Jonathan Berry, MD     Other Instructions  Important Information About Sugar       

## 2022-09-14 NOTE — Progress Notes (Signed)
Cardiology Office Note:    Date:  09/16/2022   ID:  Kyle Chavez, Kyle Chavez 1966/08/26, MRN [id number]  PCP:  Street, Sharon Mt, MD   [place] Providers Cardiologist:  Quay Burow, MD     Referring MD: Street, Sharon Mt, *   Chief Complaint  Patient presents with   Follow-up    8 months.    History of Present Illness:    Kyle Chavez is a 56 y.o. male with a hx of CAD, HLD, HTN and OSA.  Patient underwent two-vessel PCI and stenting in July 2010 in the left circumflex artery and RCA, he did have moderate 70% lesion in ramus that was treated medically.  He has strong family history of heart disease.  His father had MI since early 37s and eventually had ICD.  Paternal grandfather died of MI at age 54 and maternal grandfather died of MI in his 74s.  Repeat cardiac catheterization in April 2012 revealed patent stents, diffusely diseased ramus branch, unchanged from the previous cath.  Cardiac catheterization in March 2013 showed similar anatomy.  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  Cardiac catheterization in December 2014 at [facility] did not show acute change either.  Cardiac catheterization in August 2017 showed 85% D1 lesion, 50% proximal ramus lesion, patent distal RCA and the left circumflex stent, medical therapy was recommended.  Patient was last seen by Dr. Alvester Chou on 12/30/2021 at which time he was still having occasional chest pain which is unchanged in frequency or severity.  LDL was 84, lab work was planned to be repeated in 3 months.  Due to persistently elevated LDL, pravastatin was switched to atorvastatin 40 mg daily.  Repeat blood work in April 2023 showed a very well-controlled LDL 56.  Normal liver enzyme.  Patient presents today for follow-up.  The sudden sharp shooting pain lateral to the left breast is still occurring randomly and it has not shown any increasing frequency or duration.   Symptom does not occur when he do strenuous activity.  His left foot is in a boot due to bone spur and arthritic issues.  He complains of occasional leg swelling especially on the left side, his PCP sent him to [facility] to obtain a venous Doppler which came back negative for DVT.  I recommended continue on the hydrochlorothiazide.  He can monitor his current symptoms.  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  Overall, he is stable enough to follow-up in 6 months.    Past Medical History:  Diagnosis Date   Angina    CAD (coronary artery disease)    Dyslipidemia    ED (erectile dysfunction)    Family history of heart disease    GERD (gastroesophageal reflux disease)    History of nuclear stress test 04/18/2011   exercise myoview; normal pattern of perfusion in all regions; low risk scan    Hypertension    Migraine 03/05/12   "still have them every now and then"   Myocardial infarction ([place]) 2010   OSA on CPAP     Past Surgical History:  Procedure Laterality Date   CARDIAC CATHETERIZATION  03/05/12   patent stents, diffusely disease ramus branch   CARDIAC CATHETERIZATION N/A 07/31/2016   Procedure: Left Heart Cath and Coronary Angiography;  Surgeon: Lyn Records, MD;  Location: [facility] INVASIVE CV LAB;  Service: Cardiovascular;  Laterality: N/A;   CHOLECYSTECTOMY  ?1990's   CORONARY ANGIOPLASTY WITH STENT PLACEMENT  07/09/2009   PCI & stenting with Xience DES of circumflex & RCA   LACERATION REPAIR  ~ 2009   right thumb   LEFT HEART CATHETERIZATION WITH CORONARY ANGIOGRAM N/A 03/05/2012   Procedure: LEFT HEART CATHETERIZATION WITH CORONARY ANGIOGRAM;  Surgeon: Runell Gess, MD;  Location: [facility] CATH LAB;  Service: Cardiovascular;   Laterality: N/A;   Rt arm surgery  1960's   "caught in washing machine; have had 2 skin grafts there"   TONSILLECTOMY AND ADENOIDECTOMY     "when I was a kid"    Current Medications: Current Meds  Medication Sig   aspirin 81 MG chewable tablet Chew 81 mg by mouth daily.   carvedilol (COREG) 12.5 MG tablet TAKE 1 TABLET BY MOUTH TWICE (2) DAILY   clopidogrel (PLAVIX) 75 MG tablet TAKE ONE TABLET BY MOUTH ONCE DAILY   EMGALITY 120 MG/ML SOAJ Inject 1 mL as directed every 30 (thirty) days.   famotidine (PEPCID) 40 MG tablet Take 1 tablet (40 mg total) by mouth daily. Please call to schedule f/u with Dr Allyson Sabal for future refills. (1st attempt)   fexofenadine (ALLEGRA) 180 MG tablet Take 180 mg by mouth daily.   gabapentin (NEURONTIN) 300 MG capsule Take 300 mg by mouth 2 (two) times daily.    hydrochlorothiazide (MICROZIDE) 12.5 MG capsule TAKE 1 CAPSULE BY MOUTH DAILY   isosorbide mononitrate (IMDUR) 60 MG 24 hr tablet TAKE 1 AND 1/2 TABLET BY MOUTH DAILY   losartan (COZAAR) 50 MG tablet TAKE 1 TABLET BY MOUTH ONCE (1) DAILY   Multiple Vitamin (MULTIVITAMIN) tablet Take 1 tablet by mouth daily.   NIFEdipine (PROCARDIA-XL/NIFEDICAL-XL) 30 MG 24 hr tablet TAKE 1 TABLET BY MOUTH DAILY   nitroGLYCERIN (NITROSTAT) 0.4 MG SL tablet Place 1 tablet (0.4 mg total) under the tongue every 5 (five) minutes as needed for chest pain.   ondansetron (ZOFRAN) 8 MG tablet Take 8 mg by mouth every 8 (eight) hours as needed for nausea or vomiting.   tamsulosin (FLOMAX) 0.4 MG CAPS capsule Take 0.4 mg by mouth daily.     Allergies:   Patient has no known allergies.   Social History   Socioeconomic History   Marital status: Married    Spouse name: Not on file   Number of children: 1   Years of education: Not on file   Highest education level: Not on file  Occupational History   Occupation: Truck driver    Comment: drives for [employer]    Employer: [employer]  Tobacco Use   Smoking status: Never    Smokeless tobacco: Never  Vaping Use   Vaping Use: Never used  Substance and Sexual Activity   Alcohol use: No    Comment: "quit drinking ~ 2010"   Drug use: No   Sexual activity: Not Currently  Other Topics Concern   Not on file  Social History Narrative      Pt lives at home with his spouse.   Caffeine Use- Drinks soda daily   Social Determinants of Health   Financial Resource Strain: Not on file  Food Insecurity: Not on file  Transportation Needs: Not on file  Physical Activity: Not on  file  Stress: Not on file  Social Connections: Not on file     Family History: The patient's family history includes Cancer in his mother; Coronary artery disease in his brother and father; Healthy in his sister and sister; Heart attack in his maternal grandfather; Heart attack (age of onset: 2330) in his brother; Heart attack (age of onset: 9540) in his father; Heart disease in his paternal grandmother and another family member; Stroke in his maternal grandmother.  ROS:   Please see the history of present illness.     All other systems reviewed and are negative.  EKGs/Labs/Other Studies Reviewed:    The following studies were reviewed today:  Echo 02/22/2016 LV EF: 60% -   65%   -------------------------------------------------------------------  Indications:      Chest pain 786.51.   -------------------------------------------------------------------  History:   PMH:   Angina pectoris.  Coronary artery disease.  Risk  factors:  Hypertension. Dyslipidemia.   -------------------------------------------------------------------  Study Conclusions   - Left ventricle: The cavity size was normal. Wall thickness was    increased in a pattern of mild LVH. Systolic function was normal.    The estimated ejection fraction was in the range of 60% to 65%.    Although no diagnostic regional wall motion abnormality was    identified, this possibility cannot be completely excluded on the    basis  of this study. Features are consistent with a pseudonormal    left ventricular filling pattern, with concomitant abnormal    relaxation and increased filling pressure (grade 2 diastolic    dysfunction).  - Aortic valve: There was no stenosis.  - Mitral valve: There was trivial regurgitation.  - Right ventricle: The cavity size was normal. Systolic function    was normal.  - Pulmonary arteries: No complete TR doppler jet so unable to    estimate PA systolic pressure.  - Inferior vena cava: The vessel was normal in size. The    respirophasic diameter changes were in the normal range (>= 50%),    consistent with normal central venous pressure.  - Pericardium, extracardiac: A trivial pericardial effusion was    identified.   Impressions:   - Normal LV size with mild LV hypertrophy. Moderate diastolic    dysfunction. EF 60-65%. Normal RV size and systolic function. No    significant valvular abnormalities.   EKG:  EKG is ordered today.  The ekg ordered today demonstrates normal sinus rhythm, poor R wave progression in anterior leads.  Recent Labs: 04/10/2022: ALT 36  Recent Lipid Panel    Component Value Date/Time   CHOL 110 04/10/2022 0846   TRIG 83 04/10/2022 0846   HDL 37 (L) 04/10/2022 0846   CHOLHDL 3.0 04/10/2022 0846   CHOLHDL 3.4 02/23/2016 0235   VLDL 16 02/23/2016 0235   LDLCALC 56 04/10/2022 0846     Risk Assessment/Calculations:           Physical Exam:    VS:  BP 120/80 (BP Location: Left Arm, Patient Position: Sitting, Cuff Size: Normal)   Pulse 72   Ht 5\' 6"  (1.676 m)   Wt 191 lb (86.6 kg)   BMI 30.83 kg/m        Wt Readings from Last 3 Encounters:  09/14/22 191 lb (86.6 kg)  12/30/21 196 lb (88.9 kg)  10/08/20 181 lb (82.1 kg)     GEN:  Well nourished, well developed in no acute distress HEENT: Normal NECK: No JVD; No carotid bruits LYMPHATICS: No lymphadenopathy  CARDIAC: RRR, no murmurs, rubs, gallops RESPIRATORY:  Clear to auscultation  without rales, wheezing or rhonchi  ABDOMEN: Soft, non-tender, non-distended MUSCULOSKELETAL:  No edema; No deformity  SKIN: Warm and dry NEUROLOGIC:  Alert and oriented x 3 PSYCHIATRIC:  Normal affect   ASSESSMENT:    1. Coronary artery disease involving native coronary artery of native heart without angina pectoris   2. Essential hypertension   3. Hyperlipidemia LDL goal <70    PLAN:    In order of problems listed above:  CAD: Patient has chronic atypical chest pain lateral to the left breast.  Symptom has not increasing frequency or duration.  We will continue to monitor.  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  He does not have a updated nuclear stress test since 2016.  He says he was not aware that he needed a stress test for his DOT clearance.  From the symptom perspective, I think he is very stable from the cardiac perspective.  I asked him to verify if he need updated stress test or not.  I did provide a letter for him in support of my assessment that he is stable from a cardiac perspective.  Hypertension: Blood pressure stable  Hyperlipidemia: On Lipitor           Medication Adjustments/Labs and Tests Ordered: Current medicines are reviewed at length with the patient today.  Concerns regarding medicines are outlined above.  Orders Placed This Encounter  Procedures   EKG 12-Lead   No orders of the defined types were placed in this encounter.   Patient Instructions  Medication Instructions:  Your physician recommends that you continue on your current medications as directed. Please refer to the Current Medication list given to you today.  *If you need a refill on your cardiac medications before your next appointment, please call your pharmacy*  Lab Work: NONE ordered at this time of appointment   If you have labs (blood work) drawn today and your tests are completely normal, you will receive your results only by: MyChart Message (if you have  MyChart) OR A paper copy in the mail If you have any lab test that is abnormal or we need to change your treatment, we will call you to review the results.  Testing/Procedures: NONE ordered at this time of appointment   Follow-Up: At [facility], you and your health needs are our priority.  As part of our continuing mission to provide you with exceptional heart care, we have created designated Provider Care Teams.  These Care Teams include your primary Cardiologist (physician) and Advanced Practice Providers (APPs -  Physician Assistants and Nurse Practitioners) who all work together to provide you with the care you need, when you need it.   Your next appointment:   6 month(s)  The format for your next appointment:   In Person  Provider:   Nanetta Batty, MD     Other Instructions   Important Information About Sugar         Ramond Dial, [place]  09/16/2022 11:41 PM    [facility]

## 2023-01-17 ENCOUNTER — Other Ambulatory Visit: Payer: Self-pay

## 2023-01-17 MED ORDER — ATORVASTATIN CALCIUM 40 MG PO TABS: 40.0000 mg | ORAL_TABLET | Freq: Every day | ORAL | 3 refills | Status: DC

## 2023-02-15 ENCOUNTER — Other Ambulatory Visit: Payer: Self-pay

## 2023-02-15 MED ORDER — NIFEDIPINE ER OSMOTIC RELEASE 30 MG PO TB24: 30.0000 mg | ORAL_TABLET | Freq: Every day | ORAL | 0 refills | Status: DC

## 2023-02-23 ENCOUNTER — Other Ambulatory Visit: Payer: Self-pay | Admitting: *Deleted

## 2023-02-23 MED ORDER — CLOPIDOGREL BISULFATE 75 MG PO TABS: 75.0000 mg | ORAL_TABLET | Freq: Every day | ORAL | 3 refills | Status: DC

## 2023-03-09 ENCOUNTER — Ambulatory Visit: Payer: Managed Care, Other (non HMO) | Attending: Cardiovascular Disease | Admitting: Cardiovascular Disease

## 2023-03-09 ENCOUNTER — Encounter: Payer: Self-pay | Admitting: Cardiovascular Disease

## 2023-03-09 VITALS — BP 122/76 | HR 62 | Ht 66.0 in | Wt 197.4 lb

## 2023-03-09 DIAGNOSIS — G4733 Obstructive sleep apnea (adult) (pediatric): Secondary | ICD-10-CM

## 2023-03-09 DIAGNOSIS — R600 Localized edema: Secondary | ICD-10-CM

## 2023-03-09 DIAGNOSIS — I10 Essential (primary) hypertension: Secondary | ICD-10-CM | POA: Diagnosis not present

## 2023-03-09 DIAGNOSIS — E785 Hyperlipidemia, unspecified: Secondary | ICD-10-CM | POA: Diagnosis not present

## 2023-03-09 DIAGNOSIS — Z9861 Coronary angioplasty status: Secondary | ICD-10-CM

## 2023-03-09 DIAGNOSIS — I2510 Atherosclerotic heart disease of native coronary artery without angina pectoris: Secondary | ICD-10-CM | POA: Diagnosis not present

## 2023-03-09 NOTE — Assessment & Plan Note (Signed)
History of CAD status post two-vessel PCI and stenting via the right radial approach by myself July 2010.  He did have a moderately diffusely diseased ramus branch which was treated medically.  His last catheterization performed by Dr. Tamala Julian 07/31/2016 revealed patent distal RCA and circumflex stents, 85% first diagonal branch stenosis as well as 50% proximal ramus branch stenosis with normal LV function.  Medical therapy was recommended.  He has remained asymptomatic.

## 2023-03-09 NOTE — Assessment & Plan Note (Signed)
History of dyslipidemia on statin therapy with lipid profile performed 04/10/2022 revealing total cholesterol 110 LDL 56 and HDL 37.

## 2023-03-09 NOTE — Assessment & Plan Note (Signed)
History of obstructive sleep apnea on CPAP. 

## 2023-03-09 NOTE — Progress Notes (Signed)
03/09/2023 Kyle Chavez   Oct 21, 1966  [id number]  Primary Physician Street, Sharon Mt, MD Primary Cardiologist: Lorretta Harp MD FACP, [place], [place], [place]  HPI:  Kyle Chavez is a 57 y.o.    who I last saw in the office 10/08/2020.He has a history of coronary artery disease with two-vessel PCI and stenting via the right radial approach in July of 2010 this was the circumflex artery in his right coronary artery. He did have moderately diffuse disease of the ramus branch of 70% which was treated medically. He does have obstructive sleep apnea on CPAP, he has hypertension, hyperlipidemia and a strong family history of heart disease. Recently his father had ICD placed. His father had an MI in his early 46s, his paternal grandfather died of an MI at 51yo and his maternal grandfather died of an MI at 38yo.   He was catheterized by Dr. Ellyn Hack on April 4th 2012 revealing patent stents and a diffusely diseased ramus branch, unchanged from previous. He was then catheterized by Dr. Gwenlyn Found on March 05 2012 which showed similar anatomy.   In June 2014 he underwent exercise nuclear study which was normal no ischemia and normal LV wall motion. 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   Patient reports that in December 2014, he started having chest pressure radiating to his left arm and jaw, lasting up to 30 minutes and recurring after nitroglycerin. This was prompted by exertion but did not fully subside at rest. He went to [facility] where he was transferred to [facility] for cardiac cath despite patient's request to be transferred to [facility] where his cardiologist group is. Patient reportedly underwent cath which did not show anything new or acute. He was discharged home without any medication change.    He recently was admitted to [facility] on 02/22/16 through 02/23/16 with chest pain. He ruled out for myocardial infarction. A Myoview stress test performed 02/23/16 was  nonischemic and a 2-D echo was normal. He was discharged home and has had no recurrent symptoms.   Since then, he reports having occasional chest pressure, lasting a few minutes and resolving on its own, not requiring nitro. Pain occurs with exertion, not associated with shortness of breath, palpitations, nausea, diaphoresis or radiation to the arm and jaw. These episodes occur a couple of times per week.   He reports some dyspnea with going up a flight of stairs. He denies any lower extremity swelling. No orthopnea.   He wears his CPAP machine intermittently when he doesn't forget to wear it. He doesn't exercise. 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 He has never smoked. He is compliant with his medications.      He underwent cardiac catheterization by Dr. Tamala Julian 07/31/16 revealing a patent distal right and circumflex stent, 85% first diagonal branch stenosis and 50% proximal ramus branch stenosis with normal LV function. Medical therapy was recommended. Since I saw him a year ago he's remained asymptomatic. He is being followed for pancreatic cyst at [facility].   Since I saw him in the office over 3 years ago he is remained stable.  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  He denies chest pain or shortness of breath.   Current Meds  Medication Sig   aspirin 81 MG chewable tablet Chew 81 mg by mouth daily.   atorvastatin (  LIPITOR) 40 MG tablet Take 1 tablet (40 mg total) by mouth daily.   carvedilol (COREG) 12.5 MG tablet TAKE 1 TABLET BY MOUTH TWICE (2) DAILY   clopidogrel (PLAVIX) 75 MG tablet Take 1 tablet (75 mg total) by mouth daily.   EMGALITY 120 MG/ML SOAJ Inject 1 mL as directed every 30 (thirty) days.   famotidine (PEPCID) 40 MG tablet Take 1 tablet (40 mg total) by mouth daily. Please call to schedule f/u with Dr Gwenlyn Found for future refills. (1st attempt)   fexofenadine (ALLEGRA) 180 MG tablet Take  180 mg by mouth daily.   gabapentin (NEURONTIN) 300 MG capsule Take 300 mg by mouth 2 (two) times daily.    hydrochlorothiazide (MICROZIDE) 12.5 MG capsule TAKE 1 CAPSULE BY MOUTH DAILY   isosorbide mononitrate (IMDUR) 60 MG 24 hr tablet TAKE 1 AND 1/2 TABLET BY MOUTH DAILY   losartan (COZAAR) 50 MG tablet TAKE 1 TABLET BY MOUTH ONCE (1) DAILY   Multiple Vitamin (MULTIVITAMIN) tablet Take 1 tablet by mouth daily.   NIFEdipine (PROCARDIA-XL/NIFEDICAL-XL) 30 MG 24 hr tablet Take 1 tablet (30 mg total) by mouth daily.   nitroGLYCERIN (NITROSTAT) 0.4 MG SL tablet Place 1 tablet (0.4 mg total) under the tongue every 5 (five) minutes as needed for chest pain.   ondansetron (ZOFRAN) 8 MG tablet Take 8 mg by mouth every 8 (eight) hours as needed for nausea or vomiting.   tamsulosin (FLOMAX) 0.4 MG CAPS capsule Take 0.4 mg by mouth daily.     No Known Allergies  Social History   Socioeconomic History   Marital status: Married    Spouse name: Not on file   Number of children: 1   Years of education: Not on file   Highest education level: Not on file  Occupational History   Occupation: Truck driver    Comment: drives for [employer]    Employer: [employer]  Tobacco Use   Smoking status: Never   Smokeless tobacco: Never  Vaping Use   Vaping Use: Never used  Substance and Sexual Activity   Alcohol use: No    Comment: "quit drinking ~ 2010"   Drug use: No   Sexual activity: Not Currently  Other Topics Concern   Not on file  Social History Narrative      Pt lives at home with his spouse.   Caffeine Use- Drinks soda daily   Social Determinants of Health   Financial Resource Strain: Not on file  Food Insecurity: Not on file  Transportation Needs: Not on file  Physical Activity: Not on file  Stress: Not on file  Social Connections: Not on file  Intimate Partner Violence: Not on file     Review of Systems: General: negative for chills, fever, night sweats or weight changes.   Cardiovascular: negative for chest pain, dyspnea on exertion, edema, orthopnea, palpitations, paroxysmal nocturnal dyspnea or shortness of breath Dermatological: negative for rash Respiratory: negative for cough or wheezing Urologic: negative for hematuria Abdominal: negative for nausea, vomiting, diarrhea, bright red blood per rectum, melena, or hematemesis Neurologic: negative for visual changes, syncope, or dizziness All other systems reviewed and are otherwise negative except as noted above.    Blood pressure (!) 138/90, pulse 62, height 5\' 6"  (1.676 m), weight 197 lb 6.4 oz (89.5 kg), SpO2 95 %.  General appearance: alert and no distress Neck: no adenopathy, no carotid bruit, no JVD, supple, symmetrical, trachea midline, and thyroid not enlarged, symmetric, no tenderness/mass/nodules Lungs: clear to auscultation bilaterally Heart:  regular rate and rhythm, S1, S2 normal, no murmur, click, rub or gallop Extremities: extremities normal, atraumatic, no cyanosis or edema Pulses: 2+ and symmetric Skin: Skin color, texture, turgor normal. No rashes or lesions Neurologic: Grossly normal  EKG sinus rhythm at 62 without ST or T wave changes.  Personally reviewed this EKG.  ASSESSMENT AND PLAN:   Essential hypertension History of essential hypertension blood pressure measured today at 138/90.  He is on carvedilol and losartan as well as hydrochlorothiazide.  CAD S/P PCI July 2010 History of CAD status post two-vessel PCI and stenting via the right radial approach by myself July 2010.  He did have a moderately diffusely diseased ramus branch which was treated medically.  His last catheterization performed by Dr. Tamala Julian 07/31/2016 revealed patent distal RCA and circumflex stents, 85% first diagonal branch stenosis as well as 50% proximal ramus branch stenosis with normal LV function.  Medical therapy was recommended.  He has remained asymptomatic.  Dyslipidemia History of dyslipidemia on statin  therapy with lipid profile performed 04/10/2022 revealing total cholesterol 110 LDL 56 and HDL 37.  OSA on CPAP History of obstructive sleep apnea on CPAP  Bilateral lower extremity edema History of left lower extremity edema improved on oral diuretics.  He did have a negative DVT by venous duplex performed at [facility] last year.     Lorretta Harp MD FACP,FACC,FAHA, [facility] 03/09/2023 8:38 AM

## 2023-03-09 NOTE — Assessment & Plan Note (Signed)
History of essential hypertension blood pressure measured today at 138/90.  He is on carvedilol and losartan as well as hydrochlorothiazide.

## 2023-03-09 NOTE — Assessment & Plan Note (Signed)
History of left lower extremity edema improved on oral diuretics.  He did have a negative DVT by venous duplex performed at [facility] last year.

## 2023-03-09 NOTE — Patient Instructions (Signed)
Medication Instructions:  Your physician recommends that you continue on your current medications as directed. Please refer to the Current Medication list given to you today.  *If you need a refill on your cardiac medications before your next appointment, please call your pharmacy*   Follow-Up: At [facility], you and your health needs are our priority.  As part of our continuing mission to provide you with exceptional heart care, we have created designated Provider Care Teams.  These Care Teams include your primary Cardiologist (physician) and Advanced Practice Providers (APPs -  Physician Assistants and Nurse Practitioners) who all work together to provide you with the care you need, when you need it.  We recommend signing up for the patient portal called "MyChart".  Sign up information is provided on this After Visit Summary.  MyChart is used to connect with patients for Virtual Visits (Telemedicine).  Patients are able to view lab/test results, encounter notes, upcoming appointments, etc.  Non-urgent messages can be sent to your provider as well.   To learn more about what you can do with MyChart, go to [email or web address].    Your next appointment:   12 month(s)  Provider:   Jonathan Berry, MD    

## 2023-03-27 ENCOUNTER — Other Ambulatory Visit: Payer: Self-pay | Admitting: *Deleted

## 2023-03-27 MED ORDER — NIFEDIPINE ER OSMOTIC RELEASE 30 MG PO TB24: 30.0000 mg | ORAL_TABLET | Freq: Every day | ORAL | 3 refills | Status: DC

## 2023-04-24 ENCOUNTER — Other Ambulatory Visit: Payer: Self-pay

## 2023-04-24 MED ORDER — ISOSORBIDE MONONITRATE ER 60 MG PO TB24: 60.0000 mg | ORAL_TABLET | Freq: Every day | ORAL | 3 refills | Status: DC

## 2023-05-22 ENCOUNTER — Other Ambulatory Visit: Payer: Self-pay

## 2023-05-22 MED ORDER — CARVEDILOL 12.5 MG PO TABS: ORAL_TABLET | ORAL | 3 refills | Status: DC

## 2023-05-23 ENCOUNTER — Other Ambulatory Visit: Payer: Self-pay

## 2023-05-23 ENCOUNTER — Telehealth: Payer: Self-pay | Admitting: Cardiovascular Disease

## 2023-05-23 MED ORDER — ISOSORBIDE MONONITRATE ER 60 MG PO TB24: 90.0000 mg | ORAL_TABLET | Freq: Every day | ORAL | 3 refills | Status: DC

## 2023-05-23 NOTE — Telephone Encounter (Signed)
Pt c/o medication issue:  1. Name of Medication: isosorbide mononitrate (IMDUR) 60 MG 24 hr tablet [[id number]]   2. How are you currently taking this medication (dosage and times per day)? 1 and 1/2 tab daily   3. Are you having a reaction (difficulty breathing--STAT)?   4. What is your medication issue? Pt is taking 1 and 1/2 tab daily.  Pharmacy called and stated the way it was called in, his insurance will not pay for it.     [facility] [phone]

## 2023-05-23 NOTE — Telephone Encounter (Signed)
Spoke to pharmacist she stated she received a refill for Isosorbide 60 mg to be taken daily.Stated patient has been taking 60 mg 1&1/2 daily.After reviewing patient's med list Isosorbide listed twice with two different directions.Pharmacist with give pt Isosorbide 60 mg take 1&1/2 daily.I will send message to [facility] for review.

## 2023-05-23 NOTE — Addendum Note (Signed)
Addended by: Neoma Laming on: 05/23/2023 05:09 PM   Modules accepted: Orders

## 2023-05-23 NOTE — Telephone Encounter (Signed)
Called patient left Dr.Berry's advice on personal voice mail. 

## 2023-05-24 MED ORDER — CARVEDILOL 12.5 MG PO TABS: ORAL_TABLET | ORAL | 3 refills | Status: DC

## 2023-05-24 NOTE — Addendum Note (Signed)
Addended by: Margaret Pyle D on: 05/24/2023 11:09 AM   Modules accepted: Orders

## 2023-06-12 ENCOUNTER — Other Ambulatory Visit (HOSPITAL_BASED_OUTPATIENT_CLINIC_OR_DEPARTMENT_OTHER): Payer: Self-pay

## 2023-06-12 DIAGNOSIS — R0683 Snoring: Secondary | ICD-10-CM

## 2023-06-29 ENCOUNTER — Encounter (HOSPITAL_BASED_OUTPATIENT_CLINIC_OR_DEPARTMENT_OTHER): Payer: Managed Care, Other (non HMO) | Admitting: Internal Medicine

## 2023-07-27 ENCOUNTER — Ambulatory Visit (HOSPITAL_BASED_OUTPATIENT_CLINIC_OR_DEPARTMENT_OTHER): Payer: Managed Care, Other (non HMO) | Attending: Family Medicine | Admitting: Internal Medicine

## 2023-07-27 DIAGNOSIS — R0683 Snoring: Secondary | ICD-10-CM | POA: Diagnosis present

## 2023-07-27 DIAGNOSIS — G4733 Obstructive sleep apnea (adult) (pediatric): Secondary | ICD-10-CM | POA: Diagnosis not present

## 2023-08-04 DIAGNOSIS — R0683 Snoring: Secondary | ICD-10-CM

## 2023-08-04 NOTE — Procedures (Signed)
Patient Name: Kyle Chavez, Kyle Chavez Date: 07/27/2023 Gender: Male D.O.B: 08-09-1966 Age (years): 56 Referring Provider: Cristal Deer Street Height (inches): 66 Interpreting Physician: Jetty Duhamel MD, ABSM Weight (lbs): 200 RPSGT: Cherylann Parr BMI: 32 MRN: [id number] Neck Size: 17.00  CLINICAL INFORMATION Sleep Study Type: Split Night CPAP Indication for sleep study: Fatigue, Hypertension, Obesity, OSA, Snoring, Witnessed Apneas Epworth Sleepiness Score: 6  SLEEP STUDY TECHNIQUE As per the AASM Manual for the Scoring of Sleep and Associated Events v2.3 (April 2016) with a hypopnea requiring 4% desaturations.  The channels recorded and monitored were frontal, central and occipital EEG, electrooculogram (EOG), submentalis EMG (chin), nasal and oral airflow, thoracic and abdominal wall motion, anterior tibialis EMG, snore microphone, electrocardiogram, and pulse oximetry. Continuous positive airway pressure (CPAP) was initiated when the patient met split night criteria and was titrated according to treat sleep-disordered breathing.  MEDICATIONS Medications self-administered by patient taken the night of the study : N/A  RESPIRATORY PARAMETERS Diagnostic  Total AHI (/hr): 87.3 RDI (/hr): 87.8 OA Index (/hr): 72.5 CA Index (/hr): 0.0 REM AHI (/hr): N/A NREM AHI (/hr): 87.3 Supine AHI (/hr): 84.8 Non-supine AHI (/hr): 98.9 Min O2 Sat (%): 68.0 Mean O2 (%): 88.5 Time below 88% (min): 63.8   Titration  Optimal Pressure (cm): 15 AHI at Optimal Pressure (/hr): 7.1 Min O2 at Optimal Pressure (%): 91.0 Supine % at Optimal (%): 0 Sleep % at Optimal (%): 100   SLEEP ARCHITECTURE The recording time for the entire night was 391.7 minutes.  During a baseline period of 150.2 minutes, the patient slept for 141.5 minutes in REM and nonREM, yielding a sleep efficiency of 94.2%. Sleep onset after lights out was 5.5 minutes with a REM latency of N/A minutes. The patient spent 4.6% of the night  in stage N1 sleep, 95.4% in stage N2 sleep, 0.0% in stage N3 and 0% in REM.    During the titration period of 236.4 minutes, the patient slept for 213.2 minutes in REM and nonREM, yielding a sleep efficiency of 90.2%. Sleep onset after CPAP initiation was 23.2 minutes with a REM latency of 47.5 minutes. The patient spent 1.4% of the night in stage N1 sleep, 50.0% in stage N2 sleep, 0.0% in stage N3 and 48.6% in REM.  CARDIAC DATA The 2 lead EKG demonstrated sinus rhythm. The mean heart rate was 100.0 beats per minute. Other EKG findings include: None. LEG MOVEMENT DATA The total Periodic Limb Movements of Sleep (PLMS) were 0. The PLMS index was 0.0 .  IMPRESSIONS - Severe obstructive sleep apnea occurred during the diagnostic portion of the study (AHI = 87.3/hour). An optimal PAP pressure was selected for this patient ( 15 cm of water) - Severe oxygen desaturation was noted during the diagnostic portion of the study (Min O2 = 68.0%). Minimum O2 saturation on CPAP 15 was 91% - Loud snoring was audible during the diagnostic portion of the study. - No cardiac abnormalities were noted during this study. - Clinically significant periodic limb movements did not occur during sleep.  DIAGNOSIS - Obstructive Sleep Apnea (G47.33)  RECOMMENDATIONS - Trial of CPAP therapy on 15 cm H2O or autopap 10-20. - Patient wore a Large size Fisher&Paykel Nasal Eson mask and heated humidification. - Be careful with alcohol, sedatives and other CNS depressants that may worsen sleep apnea and disrupt normal sleep architecture. - Sleep hygiene should be reviewed to assess factors that may improve sleep quality. - Weight management and regular exercise should be initiated or continued.  [  Electronically signed] 08/04/2023 12:05 PM  Jetty Duhamel MD, ABSM Diplomate, American Board of Sleep Medicine NPI: [id number]                          Jetty Duhamel Diplomate, American Board of Sleep  Medicine  ELECTRONICALLY SIGNED ON:  08/04/2023, 12:01 PM [facility] PH: [phone]   FX: [phone] ACCREDITED BY THE AMERICAN ACADEMY OF SLEEP MEDICINE

## 2023-10-03 ENCOUNTER — Other Ambulatory Visit: Payer: Self-pay

## 2023-10-03 MED ORDER — ATORVASTATIN CALCIUM 40 MG PO TABS: 40.0000 mg | ORAL_TABLET | Freq: Every day | ORAL | 1 refills | Status: DC

## 2024-01-28 ENCOUNTER — Other Ambulatory Visit: Payer: Self-pay

## 2024-01-28 MED ORDER — CLOPIDOGREL BISULFATE 75 MG PO TABS: 75.0000 mg | ORAL_TABLET | Freq: Every day | ORAL | 0 refills | Status: DC

## 2024-02-27 ENCOUNTER — Other Ambulatory Visit: Payer: Self-pay

## 2024-02-27 MED ORDER — NIFEDIPINE ER OSMOTIC RELEASE 30 MG PO TB24: 30.0000 mg | ORAL_TABLET | Freq: Every day | ORAL | 0 refills | Status: DC

## 2024-03-12 ENCOUNTER — Encounter: Payer: Self-pay | Admitting: Cardiovascular Disease

## 2024-03-12 ENCOUNTER — Ambulatory Visit: Payer: Managed Care, Other (non HMO) | Attending: Cardiovascular Disease | Admitting: Cardiovascular Disease

## 2024-03-12 VITALS — BP 119/74 | HR 58 | Ht 66.0 in | Wt 194.0 lb

## 2024-03-12 DIAGNOSIS — G4733 Obstructive sleep apnea (adult) (pediatric): Secondary | ICD-10-CM | POA: Diagnosis not present

## 2024-03-12 DIAGNOSIS — I2510 Atherosclerotic heart disease of native coronary artery without angina pectoris: Secondary | ICD-10-CM

## 2024-03-12 DIAGNOSIS — I10 Essential (primary) hypertension: Secondary | ICD-10-CM | POA: Diagnosis not present

## 2024-03-12 DIAGNOSIS — E785 Hyperlipidemia, unspecified: Secondary | ICD-10-CM | POA: Diagnosis not present

## 2024-03-12 DIAGNOSIS — Z9861 Coronary angioplasty status: Secondary | ICD-10-CM

## 2024-03-12 NOTE — Progress Notes (Signed)
 03/12/2024 KEEVON HENNEY   1966/03/12  [id number]  Primary Physician Street, Stephanie Coup, MD Primary Cardiologist: Runell Gess MD FACP, [place], [place], [place]  HPI:  NITISH ROES is a 58 y.o.    married Caucasian male, father of 1, grandfather one 17-year-old grandchild, who I last saw in the office 03/09/2023.Marland KitchenHe has a history of coronary artery disease with two-vessel PCI and stenting via the right radial approach in July of 2010 this was the circumflex artery in his right coronary artery. He did have moderately diffuse disease of the ramus branch of 70% which was treated medically. He does have obstructive sleep apnea on CPAP, he has hypertension, hyperlipidemia and a strong family history of heart disease. Recently his father had ICD placed. His father had an MI in his early 50s, his paternal grandfather died of an MI at 40yo and his maternal grandfather died of an MI at 24yo.   He was catheterized by Dr. Herbie Baltimore on April 4th 2012 revealing patent stents and a diffusely diseased ramus branch, unchanged from previous. He was then catheterized by Dr. Allyson Sabal on March 05 2012 which showed similar anatomy.   In June 2014 he underwent exercise nuclear study which was normal no ischemia and normal LV wall motion. 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   Patient reports that in December 2014, he started having chest pressure radiating to his left arm and jaw, lasting up to 30 minutes and recurring after nitroglycerin. This was prompted by exertion but did not fully subside at rest. He went to [facility] where he was transferred to [facility] for cardiac cath despite patient's request to be transferred to [facility] where his cardiologist group is. Patient reportedly underwent cath which did not show anything new or acute. He was discharged home without any medication change.    He recently was admitted to [facility] on 02/22/16 through 02/23/16 with chest pain. He ruled  out for myocardial infarction. A Myoview stress test performed 02/23/16 was nonischemic and a 2-D echo was normal. He was discharged home and has had no recurrent symptoms.   Since then, he reports having occasional chest pressure, lasting a few minutes and resolving on its own, not requiring nitro. Pain occurs with exertion, not associated with shortness of breath, palpitations, nausea, diaphoresis or radiation to the arm and jaw. These episodes occur a couple of times per week.   He reports some dyspnea with going up a flight of stairs. He denies any lower extremity swelling. No orthopnea.   He wears his CPAP machine intermittently when he doesn't forget to wear it. He doesn't exercise. 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 He has never smoked. He is compliant with his medications.      He underwent cardiac catheterization by Dr. Katrinka Blazing 07/31/16 revealing a patent distal right and circumflex stent, 85% first diagonal branch stenosis and 50% proximal ramus branch stenosis with normal LV function. Medical therapy was recommended. Since I saw him a year ago he's remained asymptomatic. He is being followed for pancreatic cyst at [facility].   Since I saw him in the office a year ago he continues to do well.  He has a new diagnosis of obstructive sleep apnea currently wearing CPAP.  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  He denies chest pain or shortness of breath.  Current Meds  Medication Sig   aspirin 81 MG chewable tablet Chew 81 mg by mouth daily.   atorvastatin (LIPITOR) 40 MG tablet Take 1 tablet (40 mg total) by mouth daily.   carvedilol (COREG) 12.5 MG tablet Pt take 1 tablet 12.5 mg twice a day.   clopidogrel (PLAVIX) 75 MG tablet Take 1 tablet (75 mg total) by mouth daily.   EMGALITY 120 MG/ML SOAJ Inject 1 mL as directed every 30 (thirty) days.   famotidine (PEPCID) 40 MG tablet Take 1 tablet (40 mg total) by mouth  daily. Please call to schedule f/u with Dr Allyson Sabal for future refills. (1st attempt)   fexofenadine (ALLEGRA) 180 MG tablet Take 180 mg by mouth daily.   gabapentin (NEURONTIN) 300 MG capsule Take 300 mg by mouth 2 (two) times daily.    hydrochlorothiazide (MICROZIDE) 12.5 MG capsule TAKE 1 CAPSULE BY MOUTH DAILY   isosorbide mononitrate (IMDUR) 60 MG 24 hr tablet Take 1.5 tablets (90 mg total) by mouth daily.   losartan (COZAAR) 50 MG tablet TAKE 1 TABLET BY MOUTH ONCE (1) DAILY   Multiple Vitamin (MULTIVITAMIN) tablet Take 1 tablet by mouth daily.   NIFEdipine (PROCARDIA-XL/NIFEDICAL-XL) 30 MG 24 hr tablet Take 1 tablet (30 mg total) by mouth daily.   nitroGLYCERIN (NITROSTAT) 0.4 MG SL tablet Place 1 tablet (0.4 mg total) under the tongue every 5 (five) minutes as needed for chest pain.   ondansetron (ZOFRAN) 8 MG tablet Take 8 mg by mouth every 8 (eight) hours as needed for nausea or vomiting.   tamsulosin (FLOMAX) 0.4 MG CAPS capsule Take 0.4 mg by mouth daily.     No Known Allergies  Social History   Socioeconomic History   Marital status: Married    Spouse name: Not on file   Number of children: 1   Years of education: Not on file   Highest education level: Not on file  Occupational History   Occupation: Truck driver    Comment: drives for [employer]    Employer: [employer]  Tobacco Use   Smoking status: Never   Smokeless tobacco: Never  Vaping Use   Vaping status: Never Used  Substance and Sexual Activity   Alcohol use: No    Comment: "quit drinking ~ 2010"   Drug use: No   Sexual activity: Not Currently  Other Topics Concern   Not on file  Social History Narrative      Pt lives at home with his spouse.   Caffeine Use- Drinks soda daily   Social Drivers of Corporate investment banker Strain: Not on file  Food Insecurity: Not on file  Transportation Needs: Not on file  Physical Activity: Not on file  Stress: Not on file  Social Connections: Unknown (05/08/2022)    Received from [facility], [facility]   Social Network    Social Network: Not on file  Intimate Partner Violence: Unknown (03/30/2022)   Received from [facility], [facility]   HITS    Physically Hurt: Not on file    Insult or Talk Down To: Not on file    Threaten Physical Harm: Not on file    Scream or Curse: Not on file     Review of Systems: General: negative for chills, fever, night sweats or weight changes.  Cardiovascular: negative for chest pain, dyspnea on exertion, edema, orthopnea, palpitations, paroxysmal nocturnal dyspnea or shortness of breath Dermatological: negative for rash Respiratory: negative for cough or wheezing Urologic: negative for hematuria Abdominal:  negative for nausea, vomiting, diarrhea, bright red blood per rectum, melena, or hematemesis Neurologic: negative for visual changes, syncope, or dizziness All other systems reviewed and are otherwise negative except as noted above.    Blood pressure 119/74, pulse (!) 58, height 5\' 6"  (1.676 m), weight 194 lb (88 kg), SpO2 96%.  General appearance: alert and no distress Neck: no adenopathy, no carotid bruit, no JVD, supple, symmetrical, trachea midline, and thyroid not enlarged, symmetric, no tenderness/mass/nodules Lungs: clear to auscultation bilaterally Heart: regular rate and rhythm, S1, S2 normal, no murmur, click, rub or gallop Extremities: extremities normal, atraumatic, no cyanosis or edema Pulses: 2+ and symmetric Skin: Skin color, texture, turgor normal. No rashes or lesions Neurologic: Grossly normal  EKG EKG Interpretation Date/Time:  Wednesday March 12 2024 10:20:21 EDT Ventricular Rate:  58 PR Interval:  152 QRS Duration:  88 QT Interval:  398 QTC Calculation: 390 R Axis:   -7  Text Interpretation: Sinus bradycardia Low voltage QRS When compared with ECG of 18-Sep-2016 07:14, PREVIOUS ECG IS PRESENT Confirmed by Nanetta Batty [phone]) on 03/12/2024 10:37:06 AM    ASSESSMENT AND  PLAN:   Essential hypertension History of essential hypertension her blood pressure measured today at 119/74.  He is on carvedilol, hydrochlorothiazide and losartan.  CAD S/P PCI July 2010 History of CAD status post two-vessel PCI and stenting to the right radial approach July 2010 of his circumflex and RCA.  He was recatheterized by Dr. Herbie Baltimore 03/29/2011 revealing patent stents with diffuse disease in his ramus branch unchanged from previous and I catheterized him 11/2012 showing similar anatomy.  His last catheterization performed by Dr. Katrinka Blazing 07/31/2016 revealing patent RCA and circumflex stents with 85% first diagonal branch stenosis and 50% ramus branch stenosis with normal LV function.  Medical therapy was recommended.  He gets occasional "twinges" of chest pain unchanged to last saw him.  Dyslipidemia History of dyslipidemia on atorvastatin 40 mg a day with lipid profile performed 07/31/2023 revealing total cholesterol 106, LDL of 56 and HDL 36.  OSA on CPAP New diagnosis of obstructive sleep apnea on CPAP.     Runell Gess MD FACP,FACC,FAHA, [facility] 03/12/2024 10:43 AM

## 2024-03-12 NOTE — Assessment & Plan Note (Signed)
 New diagnosis of obstructive sleep apnea on CPAP.

## 2024-03-12 NOTE — Assessment & Plan Note (Signed)
 History of essential hypertension her blood pressure measured today at 119/74.  He is on carvedilol, hydrochlorothiazide and losartan.

## 2024-03-12 NOTE — Assessment & Plan Note (Signed)
 History of CAD status post two-vessel PCI and stenting to the right radial approach July 2010 of his circumflex and RCA.  He was recatheterized by Dr. Herbie Baltimore 03/29/2011 revealing patent stents with diffuse disease in his ramus branch unchanged from previous and I catheterized him 11/2012 showing similar anatomy.  His last catheterization performed by Dr. Katrinka Blazing 07/31/2016 revealing patent RCA and circumflex stents with 85% first diagonal branch stenosis and 50% ramus branch stenosis with normal LV function.  Medical therapy was recommended.  He gets occasional "twinges" of chest pain unchanged to last saw him.

## 2024-03-12 NOTE — Assessment & Plan Note (Signed)
 History of dyslipidemia on atorvastatin 40 mg a day with lipid profile performed 07/31/2023 revealing total cholesterol 106, LDL of 56 and HDL 36.

## 2024-03-12 NOTE — Patient Instructions (Signed)

## 2024-03-24 ENCOUNTER — Other Ambulatory Visit: Payer: Self-pay

## 2024-03-24 MED ORDER — ATORVASTATIN CALCIUM 40 MG PO TABS: 40.0000 mg | ORAL_TABLET | Freq: Every day | ORAL | 3 refills | Status: AC

## 2024-04-22 ENCOUNTER — Other Ambulatory Visit: Payer: Self-pay

## 2024-04-22 MED ORDER — CARVEDILOL 12.5 MG PO TABS: ORAL_TABLET | ORAL | 3 refills | Status: AC

## 2024-04-30 ENCOUNTER — Telehealth: Payer: Self-pay | Admitting: Cardiovascular Disease

## 2024-04-30 MED ORDER — ISOSORBIDE MONONITRATE ER 60 MG PO TB24: 90.0000 mg | ORAL_TABLET | Freq: Every day | ORAL | 3 refills | Status: AC

## 2024-04-30 MED ORDER — CLOPIDOGREL BISULFATE 75 MG PO TABS: 75.0000 mg | ORAL_TABLET | Freq: Every day | ORAL | 3 refills | Status: AC

## 2024-04-30 NOTE — Telephone Encounter (Signed)
*  STAT* If patient is at the pharmacy, call can be transferred to refill team.   1. Which medications need to be refilled? (please list name of each medication and dose if known) Isosorbide  and Clopidogrel    2. Would you like to learn more about the convenience, safety, & potential cost savings by using the [facility]?      3. Are you open to using the [facility] (Type [facility].    4. Which pharmacy/location (including street and city if local pharmacy) is medication to be sent to?[facility]  5. Do they need a 30 day or 90 day supply? 90 days and refills

## 2024-05-01 NOTE — Telephone Encounter (Signed)
 [facility] called needing a verbal order for medication isosorbide  mononitrate 60 mg 24hr tablets, pt taking 1.5 tablets by mouth daily (total 90 mg), dispensing 135 tablets with 3 refills. Pharmacist verbalized understanding.

## 2024-05-27 ENCOUNTER — Other Ambulatory Visit: Payer: Self-pay

## 2024-05-27 MED ORDER — NIFEDIPINE ER OSMOTIC RELEASE 30 MG PO TB24: 30.0000 mg | ORAL_TABLET | Freq: Every day | ORAL | 3 refills | Status: AC

## 2024-09-08 ENCOUNTER — Other Ambulatory Visit: Payer: Self-pay | Admitting: Cardiovascular Disease

## 2024-09-08 MED ORDER — NITROGLYCERIN 0.4 MG SL SUBL: 0.4000 mg | SUBLINGUAL_TABLET | SUBLINGUAL | 4 refills | Status: AC | PRN
